# Patient Record
Sex: Female | Born: 1965 | Race: White | Hispanic: No | Marital: Single | Smoking: Former smoker
Health system: Southern US, Community
[De-identification: ages and names within clinical notes are randomized; demographics above are authoritative.]

## PROBLEM LIST (undated history)

## (undated) DIAGNOSIS — R000 Tachycardia, unspecified: Secondary | ICD-10-CM

## (undated) DIAGNOSIS — Q046 Congenital cerebral cysts: Secondary | ICD-10-CM

## (undated) DIAGNOSIS — G35 Multiple sclerosis: Secondary | ICD-10-CM

## (undated) DIAGNOSIS — I10 Essential (primary) hypertension: Secondary | ICD-10-CM

## (undated) DIAGNOSIS — F329 Major depressive disorder, single episode, unspecified: Secondary | ICD-10-CM

## (undated) DIAGNOSIS — F32A Depression, unspecified: Secondary | ICD-10-CM

## (undated) HISTORY — DX: Depression, unspecified: F32.A

## (undated) HISTORY — DX: Multiple sclerosis: G35

## (undated) HISTORY — PX: COLONOSCOPY W/ POLYPECTOMY: SHX1380

## (undated) HISTORY — DX: Tachycardia, unspecified: R00.0

## (undated) HISTORY — DX: Congenital cerebral cysts: Q04.6

## (undated) HISTORY — PX: ACHILLES TENDON SURGERY: SHX542

## (undated) HISTORY — DX: Essential (primary) hypertension: I10

## (undated) HISTORY — DX: Major depressive disorder, single episode, unspecified: F32.9

---

## 1998-01-05 ENCOUNTER — Other Ambulatory Visit: Admission: RE | Admit: 1998-01-05 | Discharge: 1998-01-05 | Payer: Self-pay | Admitting: Obstetrics and Gynecology

## 1998-08-17 ENCOUNTER — Ambulatory Visit (HOSPITAL_COMMUNITY): Admission: RE | Admit: 1998-08-17 | Discharge: 1998-08-17 | Payer: Self-pay | Admitting: Neurosurgery

## 1998-08-17 ENCOUNTER — Encounter: Payer: Self-pay | Admitting: Neurosurgery

## 1998-09-25 ENCOUNTER — Ambulatory Visit (HOSPITAL_COMMUNITY): Admission: RE | Admit: 1998-09-25 | Discharge: 1998-09-25 | Payer: Self-pay | Admitting: Neurology

## 1999-02-14 ENCOUNTER — Ambulatory Visit (HOSPITAL_COMMUNITY): Admission: RE | Admit: 1999-02-14 | Discharge: 1999-02-14 | Payer: Self-pay | Admitting: Neurology

## 1999-02-14 ENCOUNTER — Encounter: Payer: Self-pay | Admitting: Neurology

## 1999-04-14 ENCOUNTER — Other Ambulatory Visit: Admission: RE | Admit: 1999-04-14 | Discharge: 1999-04-14 | Payer: Self-pay | Admitting: Obstetrics and Gynecology

## 1999-07-22 ENCOUNTER — Encounter: Payer: Self-pay | Admitting: Neurology

## 1999-07-22 ENCOUNTER — Ambulatory Visit (HOSPITAL_COMMUNITY): Admission: RE | Admit: 1999-07-22 | Discharge: 1999-07-22 | Payer: Self-pay | Admitting: Neurology

## 2000-01-31 ENCOUNTER — Encounter: Payer: Self-pay | Admitting: Neurology

## 2000-01-31 ENCOUNTER — Ambulatory Visit (HOSPITAL_COMMUNITY): Admission: RE | Admit: 2000-01-31 | Discharge: 2000-01-31 | Payer: Self-pay | Admitting: Neurology

## 2000-09-06 ENCOUNTER — Other Ambulatory Visit: Admission: RE | Admit: 2000-09-06 | Discharge: 2000-09-06 | Payer: Self-pay | Admitting: *Deleted

## 2000-10-23 ENCOUNTER — Encounter: Payer: Self-pay | Admitting: Neurology

## 2000-10-23 ENCOUNTER — Ambulatory Visit (HOSPITAL_COMMUNITY): Admission: RE | Admit: 2000-10-23 | Discharge: 2000-10-23 | Payer: Self-pay | Admitting: Neurology

## 2001-10-25 ENCOUNTER — Other Ambulatory Visit: Admission: RE | Admit: 2001-10-25 | Discharge: 2001-10-25 | Payer: Self-pay | Admitting: *Deleted

## 2002-02-26 ENCOUNTER — Ambulatory Visit (HOSPITAL_COMMUNITY): Admission: RE | Admit: 2002-02-26 | Discharge: 2002-02-26 | Payer: Self-pay | Admitting: Neurology

## 2002-02-26 ENCOUNTER — Encounter: Payer: Self-pay | Admitting: Neurology

## 2002-07-02 ENCOUNTER — Inpatient Hospital Stay (HOSPITAL_COMMUNITY): Admission: AD | Admit: 2002-07-02 | Discharge: 2002-07-02 | Payer: Self-pay | Admitting: Obstetrics & Gynecology

## 2002-07-11 HISTORY — PX: OTHER SURGICAL HISTORY: SHX169

## 2002-10-30 ENCOUNTER — Other Ambulatory Visit: Admission: RE | Admit: 2002-10-30 | Discharge: 2002-10-30 | Payer: Self-pay | Admitting: Obstetrics and Gynecology

## 2009-02-19 ENCOUNTER — Ambulatory Visit: Payer: Self-pay | Admitting: Family Medicine

## 2009-02-19 DIAGNOSIS — G35 Multiple sclerosis: Secondary | ICD-10-CM | POA: Insufficient documentation

## 2009-02-19 DIAGNOSIS — I10 Essential (primary) hypertension: Secondary | ICD-10-CM | POA: Insufficient documentation

## 2009-02-20 DIAGNOSIS — R740 Nonspecific elevation of levels of transaminase and lactic acid dehydrogenase [LDH]: Secondary | ICD-10-CM

## 2009-02-20 DIAGNOSIS — R7401 Elevation of levels of liver transaminase levels: Secondary | ICD-10-CM | POA: Insufficient documentation

## 2009-02-20 LAB — CONVERTED CEMR LAB
BUN: 10 mg/dL (ref 6–23)
CO2: 22 meq/L (ref 19–32)
Calcium: 9.1 mg/dL (ref 8.4–10.5)
Chloride: 106 meq/L (ref 96–112)
Cholesterol: 191 mg/dL (ref 0–200)
Creatinine, Ser: 0.72 mg/dL (ref 0.40–1.20)
Glucose, Bld: 105 mg/dL — ABNORMAL HIGH (ref 70–99)
HDL: 58 mg/dL (ref >39)
Total CHOL/HDL Ratio: 3.3

## 2009-02-23 ENCOUNTER — Encounter: Payer: Self-pay | Admitting: Family Medicine

## 2009-02-27 ENCOUNTER — Encounter: Payer: Self-pay | Admitting: Family Medicine

## 2009-03-02 LAB — CONVERTED CEMR LAB
Albumin: 4.4 g/dL (ref 3.5–5.2)
Alkaline Phosphatase: 74 units/L (ref 39–117)
HCV Ab: NEGATIVE
Indirect Bilirubin: 0.4 mg/dL (ref 0.0–0.9)
Total Bilirubin: 0.5 mg/dL (ref 0.3–1.2)

## 2009-03-06 ENCOUNTER — Encounter: Admission: RE | Admit: 2009-03-06 | Discharge: 2009-03-06 | Payer: Self-pay | Admitting: Family Medicine

## 2009-03-12 ENCOUNTER — Ambulatory Visit: Payer: Self-pay | Admitting: Family Medicine

## 2009-03-23 ENCOUNTER — Encounter: Admission: RE | Admit: 2009-03-23 | Discharge: 2009-03-23 | Payer: Self-pay | Admitting: Obstetrics and Gynecology

## 2009-03-27 ENCOUNTER — Encounter: Payer: Self-pay | Admitting: Family Medicine

## 2009-03-27 LAB — CONVERTED CEMR LAB
ALT: 30 units/L (ref 0–35)
Alkaline Phosphatase: 53 units/L (ref 39–117)
Bilirubin, Direct: 0.1 mg/dL (ref 0.0–0.3)
Indirect Bilirubin: 0.3 mg/dL (ref 0.0–0.9)
Total Protein: 7 g/dL (ref 6.0–8.3)

## 2009-05-01 ENCOUNTER — Ambulatory Visit: Payer: Self-pay | Admitting: Family Medicine

## 2009-05-04 DIAGNOSIS — R7301 Impaired fasting glucose: Secondary | ICD-10-CM | POA: Insufficient documentation

## 2009-05-04 LAB — CONVERTED CEMR LAB
BUN: 16 mg/dL (ref 6–23)
Chloride: 103 meq/L (ref 96–112)
Creatinine, Ser: 0.76 mg/dL (ref 0.40–1.20)
Glucose, Bld: 114 mg/dL — ABNORMAL HIGH (ref 70–99)

## 2009-09-07 IMAGING — US US ABDOMEN COMPLETE
1 series · 14 of 25 positions shown · non-contrast
Comparison: None

CLINICAL DATA: Elevated LFTs.

COMPLETE ABDOMINAL ULTRASOUND

[Series 1: us abdomen complete · 0.33mm/px · 14 of 61 slices shown]
[im 1/61]
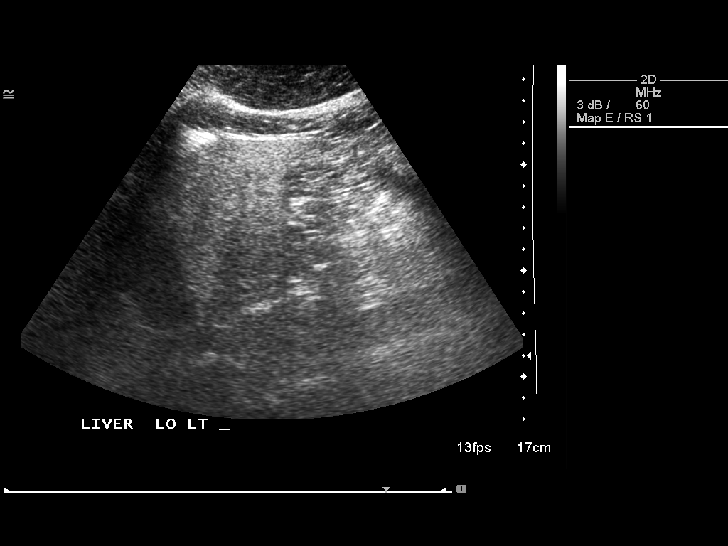
[im 6/61]
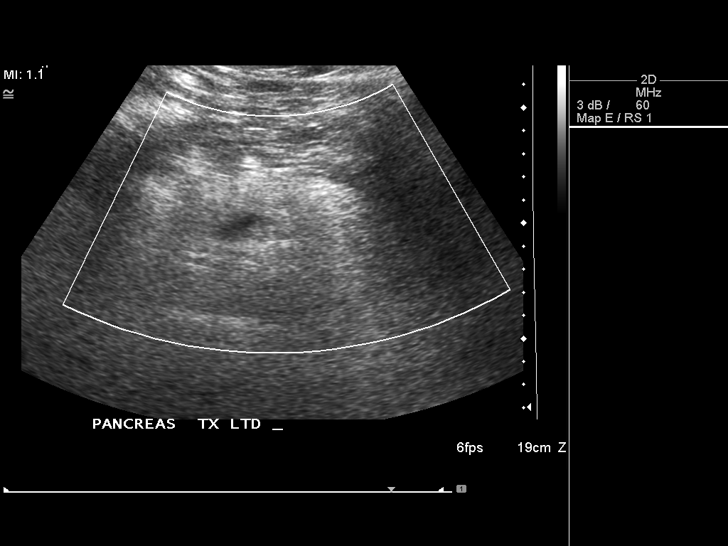
[im 11/61]
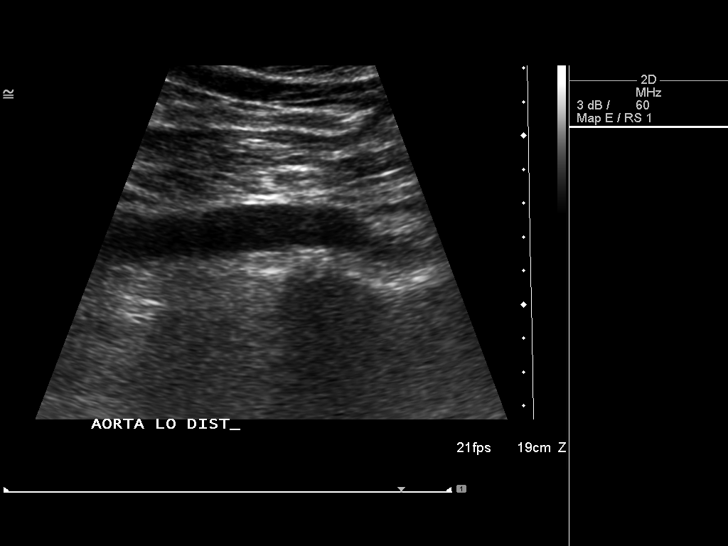
[im 16/61]
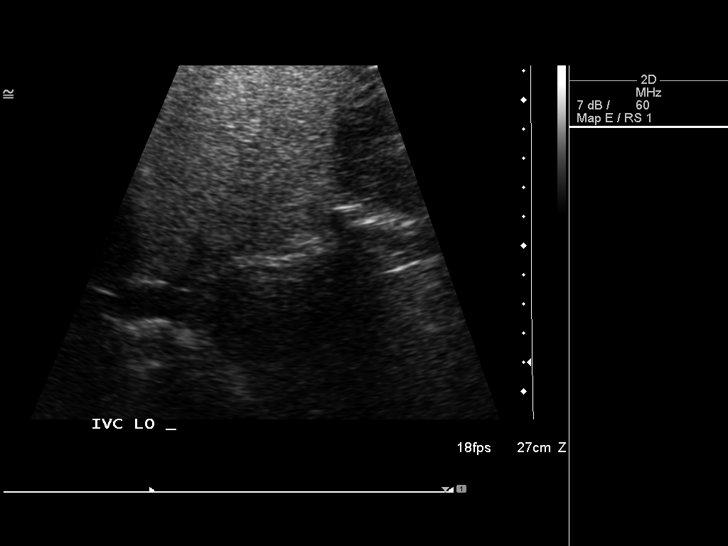
[im 21/61]
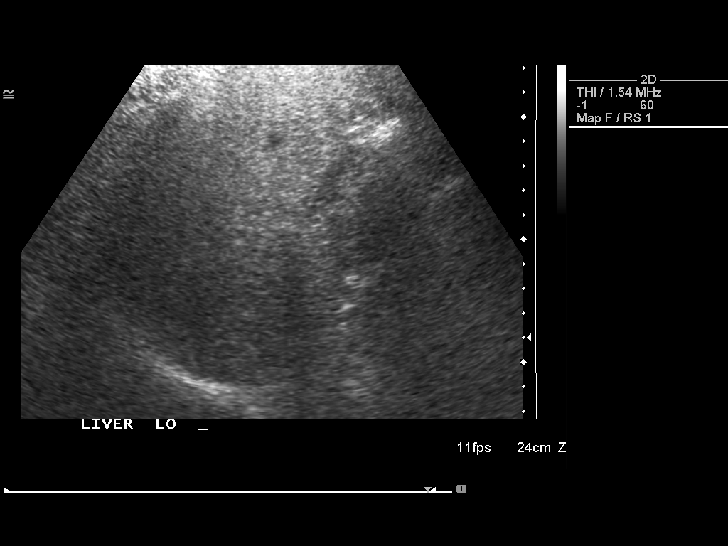
[im 23/61]
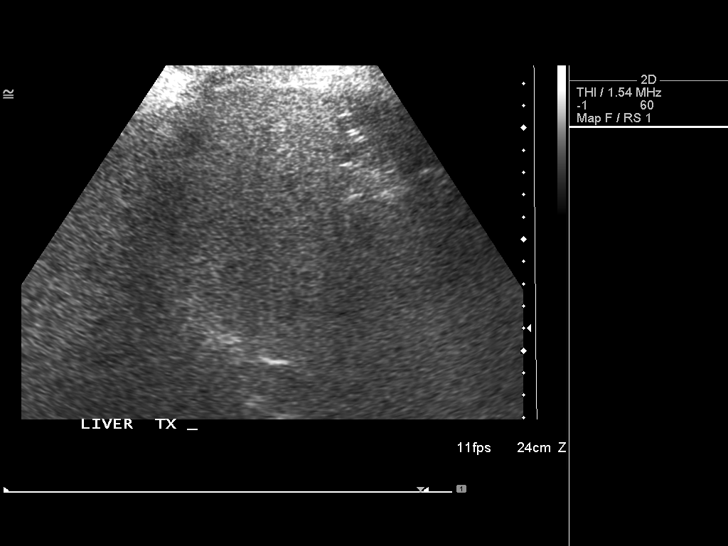
[im 28/61]
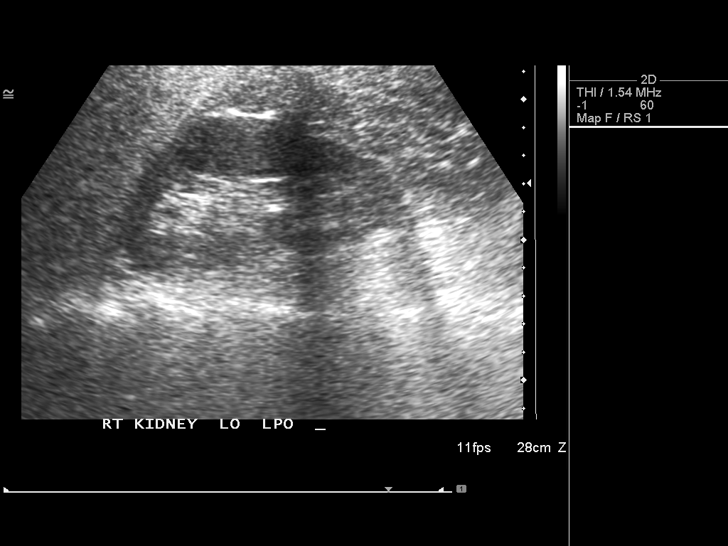
[im 33/61]
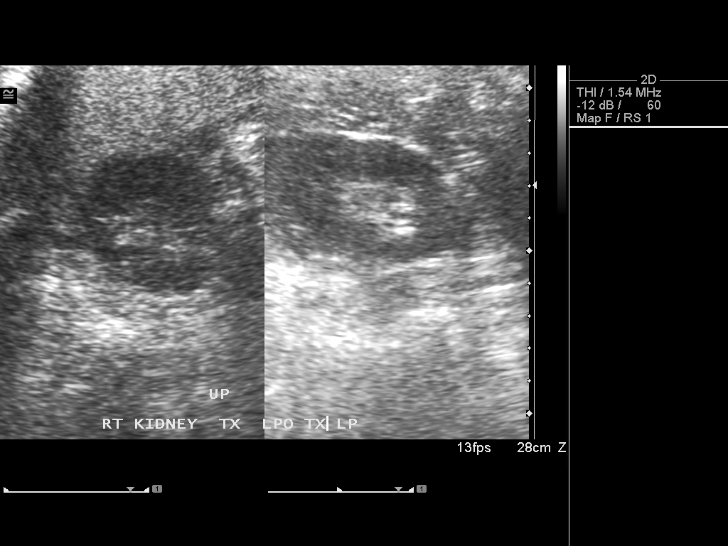
[im 38/61]
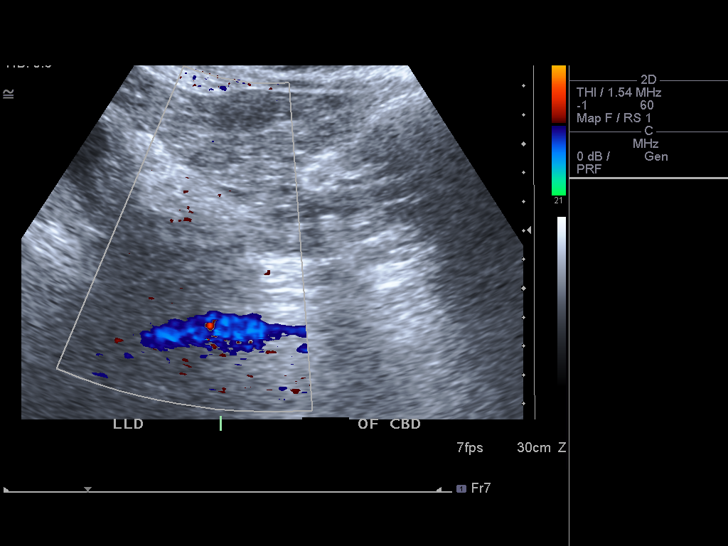
[im 41/61]
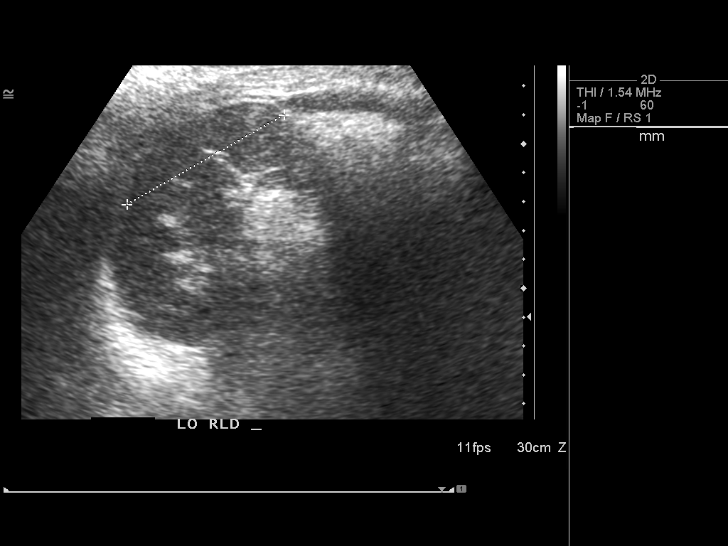
[im 46/61]
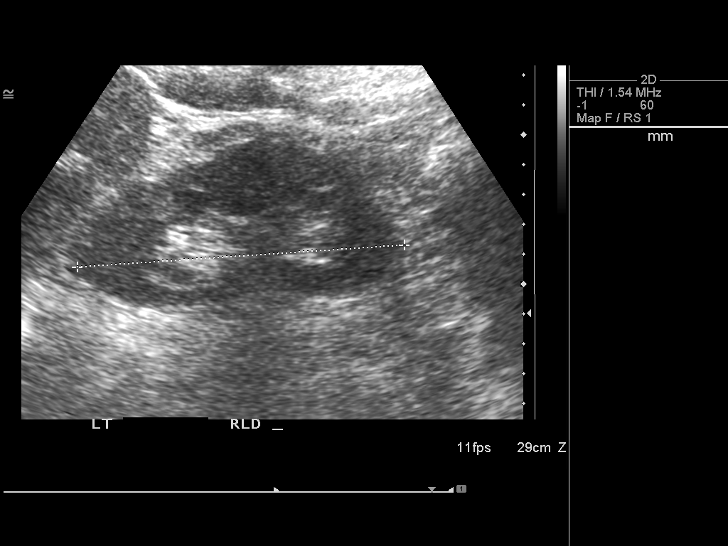
[im 51/61]
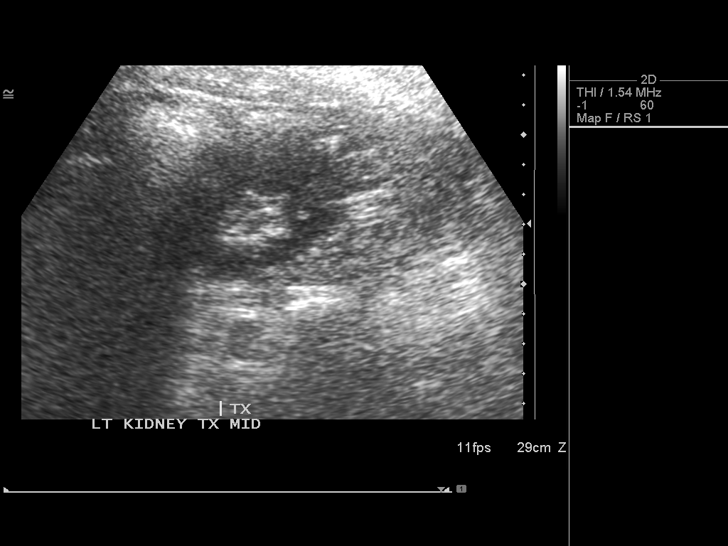
[im 56/61]
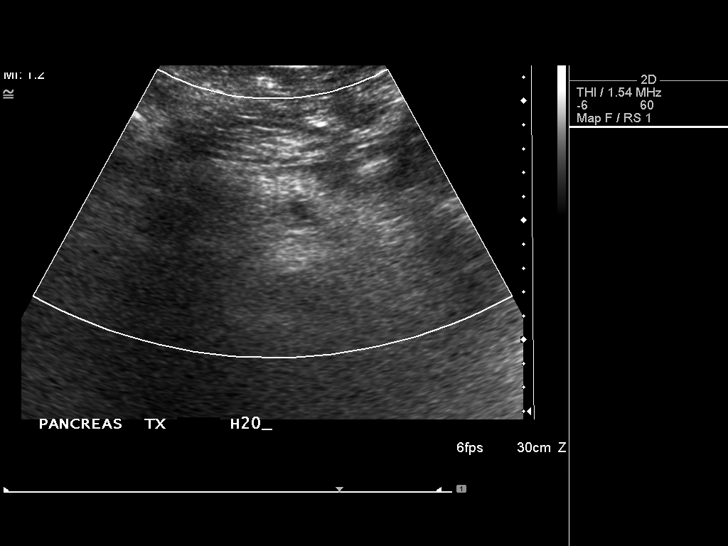
[im 61/61]
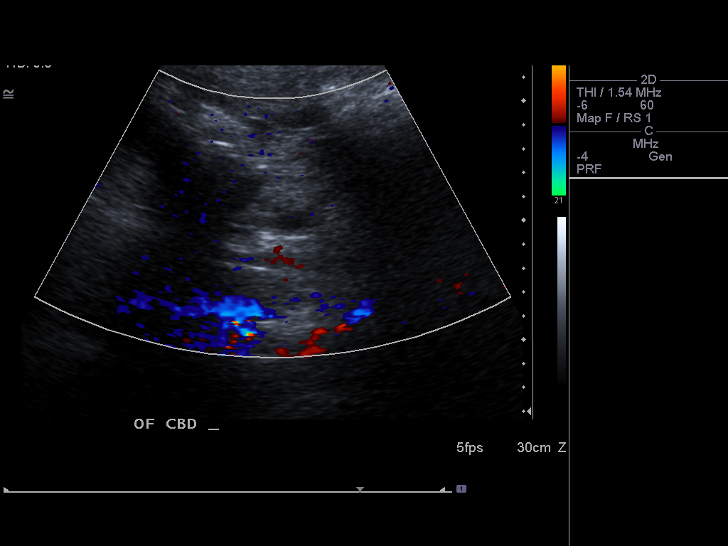

[14 of 25 positions shown; findings below may reference images not displayed]

FINDINGS: Gallbladder:  Limited visualization due to body habitus and bowel
gas.  No stones.  No tenderness over the gallbladder.

Common bile duct:  Cannot visualize due to bowel gas and body
habitus.

Liver:  Increased echogenicity compatible with fatty infiltration.
No focal abnormality.

IVC:  Visualized portions patent.

Pancreas:  Normal size and echotexture.  No focal abnormality.

Spleen:  Normal size and echotexture.  No focal abnormality.

Right Kidney:  Normal size and echotexture.  No focal abnormality.
No hydronephrosis.

Left Kidney:  Normal size and echotexture.  No focal abnormality.
No hydronephrosis.

Abdominal aorta:  Limited visualization, normal caliber, 1.9 cm.
IMPRESSION: Fatty infiltration of the liver.

Study somewhat limited by body habitus and bowel gas.

## 2009-10-12 ENCOUNTER — Ambulatory Visit: Payer: Self-pay | Admitting: Family Medicine

## 2009-10-12 DIAGNOSIS — H1045 Other chronic allergic conjunctivitis: Secondary | ICD-10-CM | POA: Insufficient documentation

## 2009-12-02 ENCOUNTER — Encounter: Payer: Self-pay | Admitting: Family Medicine

## 2010-03-11 ENCOUNTER — Encounter: Payer: Self-pay | Admitting: Family Medicine

## 2010-04-14 ENCOUNTER — Encounter: Admission: RE | Admit: 2010-04-14 | Discharge: 2010-04-14 | Payer: Self-pay | Admitting: Obstetrics and Gynecology

## 2010-08-12 NOTE — Assessment & Plan Note (Signed)
Summary: ALLERGIC conjunctivitis   Vital Signs:  Patient profile:   45 year old female Height:      64 inches Weight:      212 pounds Temp:     97.6 degrees F oral Pulse rate:   62 / minute BP sitting:   118 / 71  (left arm) Cuff size:   large  Vitals Entered By: Kathlene November (October 12, 2009 10:05 AM) CC: eyes red, itchy, swollen underneath, white drainage, matted shut this morning   Primary Care Provider:  Nani Gasser, MD  CC:  eyes red, itchy, swollen underneath, white drainage, and matted shut this morning.  History of Present Illness: eyes red, itchy, swollen underneath, white drainage, matted shut this morning. Hx of allergies for a couple of weeks. Eyes started bothering her last night.  Took 2 benadryl last night. Was outsdie all day yesterday. No new eye make up etc.  They are itchy.  Taking Zyrtec every day. Also some nasal congestion.  Never used an allergy spray.    Current Medications (verified): 1)  Zoloft 50 Mg Tabs (Sertraline Hcl) .... Take One Tablety By Mouth Once A Day 2)  Rebif 22 Mcg/0.33ml Soln (Interferon Beta-1a) .... Inject 3 Times A Week 3)  Caltrate 600 1500 Mg Tabs (Calcium Carbonate) .... Take One Tablet By Mouth Once A Day 4)  Lisinopril-Hydrochlorothiazide 10-12.5 Mg Tabs (Lisinopril-Hydrochlorothiazide) .... Take 1 Tablet By Mouth Once A Day in Am  Allergies (verified): No Known Drug Allergies  Comments:  Nurse/Medical Assistant: The patient's medications and allergies were reviewed with the patient and were updated in the Medication and Allergy Lists. Kathlene November (October 12, 2009 10:06 AM)  Physical Exam  General:  Well-developed,well-nourished,in no acute distress; alert,appropriate and cooperative throughout examination Eyes:  MIld lid edema.  PEERL and EOM intact. Sclera is very inflammed.  Ears:  External ear exam shows no significant lesions or deformities.  Otoscopic examination reveals clear canals, tympanic membranes are intact  bilaterally without bulging, retraction, inflammation or discharge. Hearing is grossly normal bilaterally. Nose:  External nasal examination shows no deformity or inflammation.  Mouth:  Oral mucosa and oropharynx without lesions or exudates.  Teeth in good repair. Neck:  No deformities, masses, or tenderness noted. Lungs:  Normal respiratory effort, chest expands symmetrically. Lungs are clear to auscultation, no crackles or wheezes. Heart:  Normal rate and regular rhythm. S1 and S2 normal without gallop, murmur, click, rub or other extra sounds. Skin:  no rashes.  Her arms and upper chest are sunburned.  Psych:  Cognition and judgment appear intact. Alert and cooperative with normal attention span and concentration. No apparent delusions, illusions, hallucinations   Impression & Recommendations:  Problem # 1:  CONJUNCTIVITIS, ALLERGIC (ICD-372.14)  Discussed treatment, and urged patient to wash hands carefully after touching face. rinse eyes with saline solution and clean lashes nightly.  Start veramyst in additoin ot her zyrtec and added pataday for her eyes.Can also switch to Zyrtec D for her nasal congestion.   Complete Medication List: 1)  Zoloft 50 Mg Tabs (Sertraline hcl) .... Take one tablety by mouth once a day 2)  Rebif 22 Mcg/0.40ml Soln (Interferon beta-1a) .... Inject 3 times a week 3)  Caltrate 600 1500 Mg Tabs (Calcium carbonate) .... Take one tablet by mouth once a day 4)  Lisinopril-hydrochlorothiazide 10-12.5 Mg Tabs (Lisinopril-hydrochlorothiazide) .... Take 1 tablet by mouth once a day in am 5)  Veramyst 27.5 Mcg/spray Susp (Fluticasone furoate) .... 2 sprays in  each nostril daily 6)  Pataday 0.2 % Soln (Olopatadine hcl) .[name] Kitchen.. 1 drop in each eye daily  Patient Instructions: 1)  Veramyst: 2 squirts in each nostril once a day 2)  Pataday: one drop in each eye once a day.  3)  If working well then call for prescription.  4)  Call if getting worse.

## 2010-08-12 NOTE — Letter (Signed)
Summary: [facility]  [facility]   Imported By: Lanelle Bal 04/12/2010 09:54:22  _____________________________________________________________________  External Attachment:    Type:   Image     Comment:   External Document

## 2010-08-12 NOTE — Letter (Signed)
Summary: [facility]   Imported By: Lanelle Bal 12/21/2009 08:27:14  _____________________________________________________________________  External Attachment:    Type:   Image     Comment:   External Document

## 2011-01-13 ENCOUNTER — Other Ambulatory Visit: Payer: Self-pay | Admitting: Family Medicine

## 2011-01-13 NOTE — Telephone Encounter (Signed)
eceived fax from [employer] and a mail order request for 90 supply was sent for lisinopril HCTZ 10-12.5 mg.  Pt has not been in recently for BP check.  Plan:  Pt notified and LMOM for pt instructing her to call the office and schedule and office visit for HTN.  It has been a while since she was in.  Told pt we can only send a 30 day local supply til she gets in to have BP checked.  Since no local pharm  Is loaded into the system pt instructed to also call triage nurse and give local pharm so we can send 30 day supply only. Jarvis Newcomer, LPN [facility]

## 2011-01-18 ENCOUNTER — Telehealth: Payer: Self-pay | Admitting: Family Medicine

## 2011-01-18 ENCOUNTER — Ambulatory Visit (INDEPENDENT_AMBULATORY_CARE_PROVIDER_SITE_OTHER): Payer: Commercial Managed Care - PPO | Admitting: Family Medicine

## 2011-01-18 VITALS — BP 147/93 | HR 66 | Ht 63.0 in | Wt 218.0 lb

## 2011-01-18 DIAGNOSIS — I10 Essential (primary) hypertension: Secondary | ICD-10-CM

## 2011-01-18 MED ORDER — LISINOPRIL-HYDROCHLOROTHIAZIDE 20-12.5 MG PO TABS: 1.0000 | ORAL_TABLET | Freq: Every day | ORAL | Status: DC

## 2011-01-18 NOTE — Progress Notes (Signed)
  Subjective:    Patient ID: Alyssa Bass, female    DOB: September 14, 1965, 45 y.o.   MRN: [id number]  HPI  . Pt denies chest pain, SOB, dizziness, or heart palpitations.  Taking meds as directed w/o problems.  Denies medication side effects.  5 min spent with pt.  Review of Systems     Objective:   Physical Exam        Assessment & Plan:

## 2011-01-18 NOTE — Progress Notes (Signed)
  Subjective:    Patient ID: Alyssa Bass, female    DOB: 06-Jan-1966, 45 y.o.   MRN: [id number]  HPI    Review of Systems     Objective:   Physical Exam        Assessment & Plan:  Spoke w/ pt. Advised of med change and the need to make an appt. Pt agreed.

## 2011-01-18 NOTE — Telephone Encounter (Signed)
Called pt and notified her of change in med and to make an appt.

## 2011-01-18 NOTE — Assessment & Plan Note (Signed)
BP not at goal. Will increase ACE dose and have her f/u with med in 1 month.

## 2011-02-18 ENCOUNTER — Encounter: Payer: Self-pay | Admitting: Family Medicine

## 2011-02-22 ENCOUNTER — Ambulatory Visit (INDEPENDENT_AMBULATORY_CARE_PROVIDER_SITE_OTHER): Payer: Commercial Managed Care - PPO | Admitting: Family Medicine

## 2011-02-22 ENCOUNTER — Encounter: Payer: Self-pay | Admitting: Family Medicine

## 2011-02-22 VITALS — BP 150/91 | HR 71 | Ht 63.0 in | Wt 223.0 lb

## 2011-02-22 DIAGNOSIS — R0683 Snoring: Secondary | ICD-10-CM

## 2011-02-22 DIAGNOSIS — I10 Essential (primary) hypertension: Secondary | ICD-10-CM

## 2011-02-22 DIAGNOSIS — R0989 Other specified symptoms and signs involving the circulatory and respiratory systems: Secondary | ICD-10-CM

## 2011-02-22 MED ORDER — LISINOPRIL-HYDROCHLOROTHIAZIDE 20-12.5 MG PO TABS: 1.0000 | ORAL_TABLET | Freq: Every day | ORAL | Status: DC

## 2011-02-22 NOTE — Assessment & Plan Note (Signed)
Discussed that she feels her BP is mcuh high than usual at home. Discussed getting back on home diet (she had been eating out a lot) and then recheck BP in 1-2 weeks. If back under control then continue current regime and f/u in 6 months. If elevated then will adust meds.

## 2011-02-22 NOTE — Progress Notes (Signed)
  Subjective:    Patient ID: Alyssa Bass, female    DOB: [birth year], 45 y.o.   MRN: [id number]  HPI  Home BPs have been well controlled. tooks med this AM.  No CP, SOB, HA or dizziness. Says has been having some hotflahses  And her stomach has been hurting some this week. Feels almost like gas pain. Maybe some bloated.    No SE on the medications. Just got bck from vacation and ate out a lot.   Not sure why hwer BP is elevated today. Takes her med regularly.   Review of Systems     Objective:   Physical Exam  Constitutional: She is oriented to person, place, and time. She appears well-developed and well-nourished.  HENT:  Head: Normocephalic and atraumatic.  Cardiovascular: Normal rate, regular rhythm and normal heart sounds.   Pulmonary/Chest: Effort normal and breath sounds normal.  Musculoskeletal: She exhibits no edema.  Neurological: She is alert and oriented to person, place, and time.  Skin: Skin is warm and dry.  Psychiatric: She has a normal mood and affect. Her behavior is normal.          Assessment & Plan:

## 2011-03-16 ENCOUNTER — Ambulatory Visit (INDEPENDENT_AMBULATORY_CARE_PROVIDER_SITE_OTHER): Payer: Commercial Managed Care - PPO | Admitting: Family Medicine

## 2011-03-16 ENCOUNTER — Other Ambulatory Visit: Payer: Self-pay | Admitting: Obstetrics and Gynecology

## 2011-03-16 DIAGNOSIS — I10 Essential (primary) hypertension: Secondary | ICD-10-CM

## 2011-03-16 DIAGNOSIS — Z1231 Encounter for screening mammogram for malignant neoplasm of breast: Secondary | ICD-10-CM

## 2011-03-16 NOTE — Progress Notes (Signed)
  Subjective:    Patient ID: Alyssa Bass, female    DOB: [birth year], 45 y.o.   MRN: [id number]  HPI  Pt denies chest pain, SOB, dizziness, or heart palpitations.  Taking meds as directed w/o problems.  Denies medication side effects.  5 min spent with pt.   Review of Systems     Objective:   Physical Exam        Assessment & Plan:  HTN- BP improved today on new medication. F./U in 6 months with MD.

## 2011-03-17 ENCOUNTER — Telehealth: Payer: Self-pay | Admitting: Family Medicine

## 2011-03-17 LAB — COMPLETE METABOLIC PANEL WITH GFR
AST: 29 U/L (ref 0–37)
Albumin: 4 g/dL (ref 3.5–5.2)
Alkaline Phosphatase: 69 U/L (ref 39–117)
BUN: 15 mg/dL (ref 6–23)
Creat: 0.72 mg/dL (ref 0.50–1.10)
GFR, Est Non African American: >60 mL/min (ref >60)
Potassium: 4.5 mEq/L (ref 3.5–5.3)
Total Bilirubin: 0.5 mg/dL (ref 0.3–1.2)

## 2011-03-17 LAB — LIPID PANEL
Cholesterol: 177 mg/dL (ref 0–200)
HDL: 44 mg/dL (ref >39)
Total CHOL/HDL Ratio: 4 Ratio
Triglycerides: 214 mg/dL — ABNORMAL HIGH (ref <150)
VLDL: 43 mg/dL — ABNORMAL HIGH (ref 0–40)

## 2011-03-17 MED ORDER — LISINOPRIL-HYDROCHLOROTHIAZIDE 20-12.5 MG PO TABS: 1.0000 | ORAL_TABLET | Freq: Every day | ORAL | Status: DC

## 2011-03-17 NOTE — Telephone Encounter (Signed)
Pt aware.

## 2011-03-17 NOTE — Telephone Encounter (Signed)
Pt called and stated she had received a call from [place] and left lab results on her VM.  Needs more clarification.  Pt also needs to know if she can get a 90 day supply of her BP med sent to [facility] since her BP reading yesterday was 125/86. Plan:  Pt notified, and needs the #'s of chol, sugar, etc.  Discussed lab #'s in detail with the pt.  Sent 90 day supply since stated pt BP rechecked yesterday and to fup in 6 mths. Jarvis Newcomer, LPN [facility]

## 2011-03-17 NOTE — Telephone Encounter (Signed)
Call pt: Alyssa Bass is 89.  One of the liver enzymes is back up a little. LDL cholesterol is better., but TG are up a little. Make sure getting some regular exercise adn eating a low fat diet. Recheck chol in one year.

## 2011-03-21 ENCOUNTER — Telehealth: Payer: Self-pay | Admitting: Family Medicine

## 2011-03-21 ENCOUNTER — Encounter: Payer: Self-pay | Admitting: Family Medicine

## 2011-03-21 NOTE — Telephone Encounter (Signed)
Called patient did receive her sleep study report and she does have some mild sleep apnea. These sleep specialist that read the study who is Dr. Orie Rout will contact her about the details and schedule her for an appointment to discuss treatment.

## 2011-03-21 NOTE — Telephone Encounter (Signed)
Pt aware.

## 2011-04-19 ENCOUNTER — Ambulatory Visit
Admission: RE | Admit: 2011-04-19 | Discharge: 2011-04-19 | Disposition: A | Discharge status: Home / Self Care | Payer: Commercial Managed Care - PPO | Source: Ambulatory Visit | Attending: Obstetrics and Gynecology | Admitting: Obstetrics and Gynecology

## 2011-04-19 DIAGNOSIS — Z1231 Encounter for screening mammogram for malignant neoplasm of breast: Secondary | ICD-10-CM

## 2011-05-23 ENCOUNTER — Encounter: Payer: Self-pay | Admitting: Family Medicine

## 2011-05-23 ENCOUNTER — Ambulatory Visit (INDEPENDENT_AMBULATORY_CARE_PROVIDER_SITE_OTHER): Payer: Commercial Managed Care - PPO | Admitting: Family Medicine

## 2011-05-23 VITALS — BP 144/87 | HR 88 | Temp 97.8°F | Wt 222.0 lb

## 2011-05-23 DIAGNOSIS — J40 Bronchitis, not specified as acute or chronic: Secondary | ICD-10-CM

## 2011-05-23 MED ORDER — HYDROCODONE-HOMATROPINE 5-1.5 MG/5ML PO SYRP: 5.0000 mL | ORAL_SOLUTION | Freq: Every evening | ORAL | Status: AC | PRN

## 2011-05-23 MED ORDER — BENZONATATE 200 MG PO CAPS: 200.0000 mg | ORAL_CAPSULE | Freq: Three times a day (TID) | ORAL | Status: AC | PRN

## 2011-05-23 MED ORDER — AZITHROMYCIN 250 MG PO TABS: ORAL_TABLET | ORAL | Status: AC

## 2011-05-23 NOTE — Patient Instructions (Signed)
Call if not better in 10 days.   

## 2011-05-23 NOTE — Progress Notes (Signed)
  Subjective:    Patient ID: Alyssa Bass, female    DOB: 01/20/1966, 45 y.o.   MRN: [id number]  HPI Woke up with severe in sinu pessure 8 days ago and then moved into her chest.  Productive sputum and now chest hurts with cough. Sinus pressure and nasal congestion is better. Taking Tylenol cold and Flu and not helping. No fever.  No GI sxs.  No ST or ear pain.  No SOB. Wheeze at night when trying to sleep.    Review of Systems     Objective:   Physical Exam  Constitutional: She is oriented to person, place, and time. She appears well-developed and well-nourished.  HENT:  Head: Normocephalic and atraumatic.  Right Ear: External ear normal.  Left Ear: External ear normal.  Nose: Nose normal.  Mouth/Throat: Oropharynx is clear and moist.       TMs and canals are clear.   Eyes: Conjunctivae and EOM are normal. Pupils are equal, round, and reactive to light.  Neck: Neck supple. No thyromegaly present.  Cardiovascular: Normal rate, regular rhythm and normal heart sounds.   Pulmonary/Chest: Effort normal and breath sounds normal. She has no wheezes.  Lymphadenopathy:    She has no cervical adenopathy.  Neurological: She is alert and oriented to person, place, and time.  Skin: Skin is warm and dry.  Psychiatric: She has a normal mood and affect.          Assessment & Plan:  Bronchitis - Likely viral. Explained that we don't use ABX for this anymore. Given nightime and daytime cough meds. Can also use delsym OTC. Did send over ABX if suddenly gets worse. O/W call if not some better in 10 days. Rec use humidifier at night to help with cough.  Stay hydrated.

## 2011-06-27 ENCOUNTER — Other Ambulatory Visit: Payer: Self-pay | Admitting: *Deleted

## 2011-06-27 MED ORDER — LISINOPRIL-HYDROCHLOROTHIAZIDE 20-12.5 MG PO TABS: 1.0000 | ORAL_TABLET | Freq: Every day | ORAL | Status: DC

## 2011-09-13 ENCOUNTER — Encounter (INDEPENDENT_AMBULATORY_CARE_PROVIDER_SITE_OTHER): Payer: Self-pay | Admitting: General Surgery

## 2011-09-13 ENCOUNTER — Ambulatory Visit (INDEPENDENT_AMBULATORY_CARE_PROVIDER_SITE_OTHER): Payer: Commercial Managed Care - PPO | Admitting: General Surgery

## 2011-09-13 VITALS — BP 138/88 | HR 98 | Temp 98.9°F | Resp 18 | Ht 64.0 in | Wt 215.6 lb

## 2011-09-13 DIAGNOSIS — R21 Rash and other nonspecific skin eruption: Secondary | ICD-10-CM

## 2011-09-13 NOTE — Progress Notes (Signed)
Patient ID: Alyssa Bass, female   DOB: [birth year], 46 y.o.   MRN: [id number]  Chief Complaint  Patient presents with  . Cyst    new pt- eval pilo cyst    HPI Alyssa Bass is a 46 y.o. female.This patient was referred by Dr. presents for evaluation of a possible pilonidal cyst. She states that she first noticed some drainage in the area of her midline buttock region approximately one year ago. She has intermittent and occasional drainage which she describes as a very slight amount. She did not notice any particular discomfort in the area and has not had any redness or fevers. HPI  Past Medical History  Diagnosis Date  . 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 Hypertension   . Depression   . MS (multiple sclerosis)     Past Surgical History  Procedure Date  . Achilles tendon surgery     Hagland deformity  . Left ankle surgery 2004  . Colonoscopy w/ polypectomy     Family History  Problem Relation Age of Onset  . Cancer      colon  . Heart attack      grandmother  . Hypertension Brother   . Hypertension Mother   . Sleep apnea Mother   . Sleep apnea Brother   . Sleep apnea Sister   . Cancer Father     pre cancerous colon    Social History History  Substance Use Topics  . Smoking status: Former Games developer  . Smokeless tobacco: Not on file  . Alcohol Use: Yes    No Known Allergies  Current Outpatient Prescriptions  Medication Sig Dispense Refill  . Calcium Carbonate (CALTRATE 600) 1500 MG TABS Take by mouth daily.        . interferon beta-1a (REBIF) 22 MCG/0.5ML injection Inject 22 mcg into the skin 3 (three) times a week.        [name] Kitchen lisinopril-hydrochlorothiazide (PRINZIDE,ZESTORETIC) 20-12.5 MG per tablet Take 1 tablet by mouth daily.  90 tablet  1  . sertraline (ZOLOFT) 50 MG tablet Take 50 mg by mouth daily.          Review of Systems Review of Systems All other review of systems negative or noncontributory except as stated in the HPI  Blood pressure  138/88, pulse 98, temperature 98.9 F (37.2 C), temperature source Temporal, resp. rate 18, height 5\' 4"  (1.626 m), weight 215 lb 9.6 oz (97.796 kg).  Physical Exam Physical Exam Physical Exam  Nursing note and vitals reviewed. Constitutional: She is oriented to person, place, and time. She appears well-developed and well-nourished. No distress.  HENT:  Head: Normocephalic and atraumatic.  Mouth/Throat: No oropharyngeal exudate.  Eyes: Conjunctivae and EOM are normal. Pupils are equal, round, and reactive to light. Right eye exhibits no discharge. Left eye exhibits no discharge. No scleral icterus.  Neck: Normal range of motion. Neck supple. No tracheal deviation present.  Cardiovascular: Normal rate Pulmonary/Chest: Effort normal No stridor. No respiratory distress. She has no wheezes.  Musculoskeletal: Normal range of motion. She exhibits no edema and no tenderness.  Neurological: She is alert and oriented to person, place, and time.  Skin: Skin is warm and dry. In the area of the buttocks and presacral region I did not see any area of inflammatory changes, erythema, tenderness, or drainage. We'll do not see any midline pits or any evidence of pilonidal cyst. She does have some moisture in the area and a slight rash  which I think is more just from moisture. There is a slight fissure in the skin in the areas well but no evidence of pilonidal cyst.. She is not diaphoretic. No erythema. No pallor.  Psychiatric: She has a normal mood and affect. Her behavior is normal. Judgment and thought content normal.   Data Reviewed   Assessment    Skin rash I do not see any evidence of pilonidal cyst on exam. She says that she has some intermittent drainage in the area I think that this is just moisture and possible skin rash but again no evidence of pilonidal cyst and see any evidence of infection or inflammatory change.    Plan    I recommended that she keep area dry and at this time, I would not  recommend any surgical intervention. I did not see any evidence of pilonidal cyst but I did recommend that she has continued drainage to come in acutely when it is draining so we can take a look at the area.       Lodema Pilot DAVID 09/13/2011, 8:47 AM

## 2011-10-26 ENCOUNTER — Telehealth: Payer: Self-pay | Admitting: *Deleted

## 2011-10-26 MED ORDER — OLOPATADINE HCL 0.2 % OP SOLN: 1.0000 [drp] | Freq: Every day | OPHTHALMIC | Status: DC

## 2011-10-26 NOTE — Telephone Encounter (Signed)
OK for refill.

## 2011-10-26 NOTE — Telephone Encounter (Signed)
Pt is requesting a refill on Pataday eye drops. She states that with the allergies she is having a hard time. Please advise.

## 2011-11-01 ENCOUNTER — Encounter: Payer: Self-pay | Admitting: Family Medicine

## 2011-11-01 DIAGNOSIS — G43909 Migraine, unspecified, not intractable, without status migrainosus: Secondary | ICD-10-CM | POA: Insufficient documentation

## 2011-11-01 DIAGNOSIS — G4733 Obstructive sleep apnea (adult) (pediatric): Secondary | ICD-10-CM | POA: Insufficient documentation

## 2012-01-25 ENCOUNTER — Other Ambulatory Visit: Payer: Self-pay | Admitting: *Deleted

## 2012-01-25 MED ORDER — LISINOPRIL-HYDROCHLOROTHIAZIDE 20-12.5 MG PO TABS: 1.0000 | ORAL_TABLET | Freq: Every day | ORAL | Status: DC

## 2012-03-14 ENCOUNTER — Other Ambulatory Visit: Payer: Self-pay | Admitting: Obstetrics and Gynecology

## 2012-03-14 DIAGNOSIS — Z1231 Encounter for screening mammogram for malignant neoplasm of breast: Secondary | ICD-10-CM

## 2012-03-21 ENCOUNTER — Other Ambulatory Visit: Payer: Self-pay | Admitting: Family Medicine

## 2012-04-24 ENCOUNTER — Ambulatory Visit (INDEPENDENT_AMBULATORY_CARE_PROVIDER_SITE_OTHER): Payer: Commercial Managed Care - PPO

## 2012-04-24 DIAGNOSIS — Z1231 Encounter for screening mammogram for malignant neoplasm of breast: Secondary | ICD-10-CM

## 2012-05-16 ENCOUNTER — Other Ambulatory Visit: Payer: Self-pay | Admitting: Family Medicine

## 2012-05-16 NOTE — Telephone Encounter (Signed)
Must make appointment 

## 2012-06-11 DIAGNOSIS — E348 Other specified endocrine disorders: Secondary | ICD-10-CM | POA: Insufficient documentation

## 2012-10-15 ENCOUNTER — Other Ambulatory Visit: Payer: Self-pay | Admitting: Family Medicine

## 2012-11-15 ENCOUNTER — Telehealth: Payer: Self-pay | Admitting: Neurology

## 2012-11-15 MED ORDER — SERTRALINE HCL 50 MG PO TABS: 50.0000 mg | ORAL_TABLET | Freq: Every day | ORAL | Status: DC

## 2012-11-15 NOTE — Telephone Encounter (Signed)
Message copied by Malachy Moan on Thu Nov 15, 2012  1:25 PM ------      Message from: Warren Lacy A      Created: Thu Nov 15, 2012 10:44 AM      Contact: Alyssa Bass        needs a refill on her Zoloft. [facility](3 month) [phone]. Only has a week left, last time she ran out, she cried all the time.            THanks ------

## 2012-11-15 NOTE — Telephone Encounter (Signed)
[facility] patient assigned to Dr Vickey Huger.  I called patient, she says she is waiting for a call back from [place] about appt.  Says she spoke with front desk and they told her they will call her back.

## 2012-11-21 ENCOUNTER — Encounter: Payer: Self-pay | Admitting: Family Medicine

## 2012-11-21 ENCOUNTER — Ambulatory Visit (INDEPENDENT_AMBULATORY_CARE_PROVIDER_SITE_OTHER): Payer: Commercial Managed Care - PPO | Admitting: Family Medicine

## 2012-11-21 VITALS — BP 135/81 | HR 67 | Ht 64.0 in | Wt 216.0 lb

## 2012-11-21 DIAGNOSIS — I10 Essential (primary) hypertension: Secondary | ICD-10-CM

## 2012-11-21 DIAGNOSIS — G35 Multiple sclerosis: Secondary | ICD-10-CM

## 2012-11-21 DIAGNOSIS — Z23 Encounter for immunization: Secondary | ICD-10-CM

## 2012-11-21 MED ORDER — LISINOPRIL-HYDROCHLOROTHIAZIDE 20-12.5 MG PO TABS: ORAL_TABLET | ORAL | Status: DC

## 2012-11-21 NOTE — Progress Notes (Signed)
  Subjective:    Patient ID: Alyssa Bass, female    DOB: 04-Sep-1965, 47 y.o.   MRN: [id number]  HPI HTN-  Pt denies chest pain, SOB, dizziness, or heart palpitations.  Taking meds as directed w/o problems.  Denies medication side effects.  5 min spent with pt.  Her neurologist retired and she is looking for a new neurologist. She is to  one of his partners  In October but she is concerned.  She wants to know if I have any specific recommendations for her. She also needs up-to-date blood work to look at her liver since she is on Rebif.  Needs flu shot and Tdap updated.  Her father-in-law was recently diagnosed with multiple myeloma and the family members were instructed to get up to date flu shots even though it after the usual season if they are not to be around him. Review of Systems     Objective:   Physical Exam  Constitutional: She is oriented to person, place, and time. She appears well-developed and well-nourished.  HENT:  Head: Normocephalic and atraumatic.  Cardiovascular: Normal rate, regular rhythm and normal heart sounds.   Pulmonary/Chest: Effort normal and breath sounds normal.  Neurological: She is alert and oriented to person, place, and time.  Skin: Skin is warm and dry.  Psychiatric: She has a normal mood and affect. Her behavior is normal.          Assessment & Plan:  HTN - REfill meds. Due for CMP. Well controlled. Continue current regimen. Followup in 6 months.  Vaccines updated.  Flu vaccine and tdap given today.  Multiple sclerosis-we will check her liver enzymes to make sure that everything looks K. on the Rebif. We'll be happy to send a copy of these to go for neurology. It looks like her upcoming appointment in October is with Dr. Vickey Huger . Actually think she will really like her but if she does not then she can certainly see Dr. Burnett Corrente at [facility] neurology or possibly Dr. Arbutus Leas at [facility] neurology.

## 2012-11-21 NOTE — Addendum Note (Signed)
Addended by: Deno Etienne on: 11/21/2012 01:08 PM   Modules accepted: Orders

## 2012-11-22 LAB — COMPLETE METABOLIC PANEL WITH GFR
AST: 48 U/L — ABNORMAL HIGH (ref 0–37)
Alkaline Phosphatase: 65 U/L (ref 39–117)
BUN: 15 mg/dL (ref 6–23)
Creat: 0.74 mg/dL (ref 0.50–1.10)
GFR, Est Non African American: >89 mL/min
Glucose, Bld: 103 mg/dL — ABNORMAL HIGH (ref 70–99)
Total Bilirubin: 0.5 mg/dL (ref 0.3–1.2)

## 2012-11-22 LAB — LIPID PANEL
Cholesterol: 152 mg/dL (ref 0–200)
Total CHOL/HDL Ratio: 3.6 Ratio
Triglycerides: 103 mg/dL (ref <150)
VLDL: 21 mg/dL (ref 0–40)

## 2012-11-27 ENCOUNTER — Telehealth: Payer: Self-pay | Admitting: *Deleted

## 2012-11-27 DIAGNOSIS — I10 Essential (primary) hypertension: Secondary | ICD-10-CM

## 2012-11-27 DIAGNOSIS — R748 Abnormal levels of other serum enzymes: Secondary | ICD-10-CM

## 2012-11-27 NOTE — Telephone Encounter (Signed)
Repeat of cmp.Alyssa Bass [place]

## 2012-12-07 ENCOUNTER — Telehealth: Payer: Self-pay

## 2012-12-07 NOTE — Telephone Encounter (Signed)
Primarily, just needs to avoid Tylenol products. Can use things like Aleve or ibuprofen or aspirin.

## 2012-12-07 NOTE — Telephone Encounter (Signed)
Left message

## 2012-12-07 NOTE — Telephone Encounter (Signed)
Alyssa Bass left a message on voice mail stating:  She wants to know what pain medication she can take for her headaches. She knows she cannot take certain once due to her elevated liver.

## 2013-01-01 ENCOUNTER — Other Ambulatory Visit: Payer: Self-pay | Admitting: *Deleted

## 2013-01-01 DIAGNOSIS — R748 Abnormal levels of other serum enzymes: Secondary | ICD-10-CM

## 2013-01-01 DIAGNOSIS — I10 Essential (primary) hypertension: Secondary | ICD-10-CM

## 2013-01-01 LAB — COMPREHENSIVE METABOLIC PANEL
AST: 24 U/L (ref 0–37)
Albumin: 3.9 g/dL (ref 3.5–5.2)
Alkaline Phosphatase: 58 U/L (ref 39–117)
BUN: 14 mg/dL (ref 6–23)
Calcium: 9.2 mg/dL (ref 8.4–10.5)
Creat: 0.76 mg/dL (ref 0.50–1.10)
Glucose, Bld: 109 mg/dL — ABNORMAL HIGH (ref 70–99)
Potassium: 4.8 mEq/L (ref 3.5–5.3)

## 2013-01-04 ENCOUNTER — Other Ambulatory Visit: Payer: Self-pay | Admitting: Family Medicine

## 2013-01-04 MED ORDER — TRAMADOL HCL 50 MG PO TABS: 50.0000 mg | ORAL_TABLET | Freq: Two times a day (BID) | ORAL | Status: DC | PRN

## 2013-01-24 ENCOUNTER — Other Ambulatory Visit: Payer: Self-pay | Admitting: Neurology

## 2013-02-15 ENCOUNTER — Other Ambulatory Visit: Payer: Self-pay

## 2013-02-15 MED ORDER — INTERFERON BETA-1A 44 MCG/0.5ML ~~LOC~~ SOLN: 44.0000 ug | SUBCUTANEOUS | Status: DC

## 2013-02-15 NOTE — Telephone Encounter (Signed)
[facility] patient assigned to Dr Dohmeier  

## 2013-02-20 ENCOUNTER — Other Ambulatory Visit: Payer: Self-pay

## 2013-02-20 MED ORDER — INTERFERON BETA-1A 44 MCG/0.5ML ~~LOC~~ SOLN: 44.0000 ug | SUBCUTANEOUS | Status: DC

## 2013-03-18 ENCOUNTER — Other Ambulatory Visit: Payer: Self-pay | Admitting: Family Medicine

## 2013-04-11 ENCOUNTER — Other Ambulatory Visit: Payer: Self-pay | Admitting: Family Medicine

## 2013-04-17 ENCOUNTER — Encounter (INDEPENDENT_AMBULATORY_CARE_PROVIDER_SITE_OTHER): Payer: Self-pay

## 2013-04-17 ENCOUNTER — Encounter: Payer: Self-pay | Admitting: Neurology

## 2013-04-17 ENCOUNTER — Ambulatory Visit (INDEPENDENT_AMBULATORY_CARE_PROVIDER_SITE_OTHER): Payer: Commercial Managed Care - PPO | Admitting: Neurology

## 2013-04-17 VITALS — BP 112/77 | HR 105 | Resp 16 | Ht 63.0 in | Wt 218.0 lb

## 2013-04-17 DIAGNOSIS — R5381 Other malaise: Secondary | ICD-10-CM | POA: Insufficient documentation

## 2013-04-17 DIAGNOSIS — G35 Multiple sclerosis: Secondary | ICD-10-CM

## 2013-04-17 DIAGNOSIS — R000 Tachycardia, unspecified: Secondary | ICD-10-CM | POA: Insufficient documentation

## 2013-04-17 DIAGNOSIS — E669 Obesity, unspecified: Secondary | ICD-10-CM | POA: Insufficient documentation

## 2013-04-17 MED ORDER — AMPHETAMINE-DEXTROAMPHETAMINE 12.5 MG PO TABS: 12.5000 mg | ORAL_TABLET | Freq: Every day | ORAL | Status: DC

## 2013-04-17 MED ORDER — DIMETHYL FUMARATE 120 & 240 MG PO MISC: 120.0000 mg | Freq: Two times a day (BID) | ORAL | Status: DC

## 2013-04-17 NOTE — Patient Instructions (Signed)
Multiple Sclerosis Multiple sclerosis (MS) is a disease of the central nervous system. Its cause is unknown. It is more common in the northern states than in the southern states. There is a higher incidence of MS in women. There is a wide variation in the symptoms (problems) of MS. This is because of the many different ways it affects the central nervous system. It often comes on in episodes or attacks. These attacks may last weeks to months. There may be long periods of nearly no problems between attacks. The main symptoms include visual problems (associated with eye pain), numbness, weakness, and paralysis in extremities (arms/hands and legs/feet). There may also be tremors and problems with balance and walking. The age when MS starts is variable. Advances in medicine continue to improve the treatment of this illness. There is no known cure for MS but there are medications that help. MS is not an inherited illness, although your risk of getting this disease is higher if you have a relative with MS. The best radiologic (x-ray) study for MS is an MRI (magnetic resonance imaging). There are medications available to decrease the number and frequency of attacks. SYMPTOMS  The symptoms of MS are caused by loss of insulation (myelin) of the nerves of the brain. When this happens, brain signals do not get transmitted properly or may not get transmitted at all. Some of the problems caused by this include:   Numbness.  Weakness.  Paralysis in extremities.  Visual problems, eye pain.  Balance problems.  Tremors. DIAGNOSIS  Your caregiver can do studies on you to make this diagnosis. This may include specialized X-rays and spinal fluid studies. HOME CARE INSTRUCTIONS   Take medications as directed by your caregiver. Baclofen is a drug commonly used to reduce muscle spasticity. Steroids are often used for short term relief.  Exercise as directed.  Use physical and occupational therapy as directed by  your caregiver. Careful attention to this medical care can help avoid depression.  See your caregiver if you begin to have problems with depression. This is a common problem in MS. Patients often continue to work many years after the diagnosis of MS. Document Released: 06/24/2000 Document Revised: 09/19/2011 Document Reviewed: 01/31/2007 [employer] Patient Information 2014 [employer].  

## 2013-04-17 NOTE — Progress Notes (Addendum)
[facility]  Provider:  Melvyn Novas, M D  Referring Provider: Agapito Games, * Primary Care Physician:  Alyssa Gasser, MD  Chief Complaint  Patient presents with  . NP    Transfer of care (Dr. Sandria Manly) Vision was taken today as well Rt eye was 20/30 Lt eye was 20/40 MOCA was given as well score is notated at bottom of test    HPI:  Alyssa Bass is a 47 y.o. female  Is seen here as a referral  from Dr. Linford Arnold for unknown/ unspecified  Reason, was supposed to see NP. The patient has Multiple sclerosis since 1997, after delivering a healthy baby daughter. A spinal tap had revealed only one band, but she developed optic neuritis in 2000, when she developed sensory symptoms.  A repeat CSF test revealed than in 2000 the oligoclonal bands and established the diagnosis.    Alyssa Bass, a former patient of Dr. Avie Echevaria,  has been followed by him for her multiple sclerosis- but she also carries a diagnosis of an  AVM and intra-ventricular cyst.  To  screen for progression in both diseases she has undergone regular MRIs. She is also followed at [facility] where she was seen, from where the more recent imaging studies are available .  I have a copy here of her last MRI from 9 2012 showing that the patient has multiple foci of periventricular and deep white matter lesions but predominantly perivascular. Her  white matter disease has gradually progressed since the  Baseline image and examination in 2002.  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  There was also no significant change in the left frontal venous angioma.    Review of Systems: Out of a complete 14 system review, the patient complains of only the following symptoms, and all other reviewed systems are negative. rebif  Causes headaches, she takes Advil every 4 hours during the day of injection.  Skin  reaction. REBIF. reinject did not help .  She had elevated LFT.  Obesity.   History   Social History  . Marital Status: Married    Spouse Name: N/A    Number of Children: 2  . Years of Education: 14   Occupational History  .      Psychologist, forensic   Social History Main Topics  . Smoking status: Former Games developer  . Smokeless tobacco: Not on file  . Alcohol Use: Yes  . Drug Use: No  . Sexual Activity:    Other Topics Concern  . Not on file   Social History Narrative   Pt is Rt handed as well as has college degree Pt is married as well as has children.Pt does take in at least 1 cup of coffee    Family History  Problem Relation Age of Onset  . Cancer      colon  . Heart attack      grandmother  . Hypertension Brother   . Hypertension Mother   . Sleep apnea Mother   . Sleep apnea Brother   . Sleep apnea Sister   . Cancer Father     pre cancerous colon    Past Medical History  Diagnosis Date  . 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 Hypertension   . Depression   . MS (multiple sclerosis)   . Tachycardia  Past Surgical History  Procedure Laterality Date  . Achilles tendon surgery      Hagland deformity  . Left ankle surgery  2004  . Colonoscopy w/ polypectomy      Current Outpatient Prescriptions  Medication Sig Dispense Refill  . Calcium Carbonate (CALTRATE 600) 1500 MG TABS Take by mouth daily.        . interferon beta-1a (REBIF) 44 MCG/0.5ML injection Inject 0.5 mL (44 mcg total) into the skin 3 (three) times a week.  6 mL  1  . lisinopril-hydrochlorothiazide (PRINZIDE,ZESTORETIC) 20-12.5 MG per tablet TAKE 1 TABLET DAILY  90 tablet  1  . sertraline (ZOLOFT) 50 MG tablet TAKE 1 TABLET DAILY (NEED TO BE SEEN)  90 tablet  1  . traMADol (ULTRAM) 50 MG tablet TAKE 1 TABLET (50 MG TOTAL) BY MOUTH 2 (TWO) TIMES DAILY AS NEEDED FOR PAIN.  60 tablet  0  . fluticasone (FLONASE) 50 MCG/ACT nasal spray Place 2 sprays into the nose daily.      [name] Kitchen levocetirizine  (XYZAL) 5 MG tablet Take 1 tablet by mouth at bedtime.      . Olopatadine HCl 0.2 % SOLN Apply 1 drop to eye daily.  1 Bottle  0   No current facility-administered medications for this visit.    Allergies as of 04/17/2013  . (No Known Allergies)    Vitals: BP 112/77  Pulse 105  Resp 16  Ht 5\' 3"  (1.6 m)  Wt 218 lb (98.884 kg)  BMI 38.63 kg/m2 Last Weight:  Wt Readings from Last 1 Encounters:  04/17/13 218 lb (98.884 kg)   Last Height:   Ht Readings from Last 1 Encounters:  04/17/13 5\' 3"  (1.6 m)    Physical exam:  General: The patient is awake, alert and appears not in acute distress. The patient is well groomed. Head: Normocephalic, atraumatic. Neck is supple. Mallampati 3 , neck circumference: 15.25. Cardiovascular:  Regular rate and rhythm, without  murmurs or carotid bruit, and without distended neck veins. Respiratory: Lungs are clear to auscultation. Skin:  Without evidence of edema, or rash Trunk: BMI is  elevated . The patient  has normal posture.  Neurologic exam : The patient is awake and alert, oriented to place and time.  Memory subjective  described as impaired . There is a normal attention span & concentration ability.MOCA 30-30.  She noticed problems focussing ,  Speech is fluent without dysarthria, dysphonia or aphasia. Mood and affect are der pressed, frustrated. .  Cranial nerves: Pupils are equal and briskly reactive to light. Funduscopic exam without  evidence of pallor or edema. Extraocular movements  in vertical and horizontal planes intact and without nystagmus. Her left eye seems to move further inward with conjunction  than the right, but she deieis diplopia.   Visual fields by finger perimetry are intact. Hearing to finger rub intact.  Facial sensation intact to fine touch. Facial motor strength is symmetric and tongue and uvula move midline.  Motor exam:   Normal tone and normal muscle bulk and symmetric normal strength in all  extremities.  Sensory:  Fine touch, pinprick and vibration were tested in all extremities. Proprioception is  Normal. Right hand fingertips are numb to fine filament .  Coordination: Rapid alternating movements in the fingers/hands is tested and normal. Finger-to-nose maneuver tested and normal without evidence of ataxia, dysmetria  but  Tremor close to target..  Gait and station: Patient walks without assistive device . Strength within normal limits. Stance is stable  and normal. Tandem gait is  unfragmented. Romberg testing is  normal.  Deep tendon reflexes: in the  upper and lower extremities are symmetric and intact. Babinski maneuver response is  downgoing.   Assessment:  After physical and neurologic examination, review of laboratory studies, imaging, neurophysiology testing and pre-existing records, assessment is  That of MS with initial manifestations following a pregnancy, optic neuritis and sensory loss, transient.  Rebif , with little progression of white matter lesions since 2008-2012, but  none recently .   Plan:  Treatment plan and additional workup : Patient needs JCV scan , varicella zoster.  Flu shot tomorrow before any immunomodulation changes are initiated. Stevan Born is best option for her,   I explained to the patient that Donnamarie Poag is an option for patients attest negative for the JC virus. However it is normally used for more aggressive forms of MS -  To initiate  Gilenya therapy   A varicella-zoster virus AB status has to be checked as well and I would like her to keep her options open.  Discussed Aubagio, but this  is not an attractive choice for this patient. Tysabri should still be considered , should she not tolerate the oral medication.  Seems to have elevated her liver enzymes but this could also occur with the oral medications for the treatment of a mass. I will obtain a new liver function panel, a CBC with differential, JC virus and varicella-zoster virus. She  will stay for now on Rebif.  When she returns from her trip to [facility] in late October we can leave the decision to change her to other medications from rebirth on the lap results. The patient mainly has nuchal fatigued and has injection site reactions as well as DD flulike symptoms and has been taking nonsteroidals every day of rebirth injection. On the other hand she has not had a major relapse since being on the drug. aubagio- gall bladder problems,  gilenya entry barrier is too high.

## 2013-04-18 LAB — COMPREHENSIVE METABOLIC PANEL
ALT: 55 IU/L — ABNORMAL HIGH (ref 0–32)
AST: 38 IU/L (ref 0–40)
CO2: 24 mmol/L (ref 18–29)
Calcium: 9.7 mg/dL (ref 8.7–10.2)
Chloride: 95 mmol/L — ABNORMAL LOW (ref 97–108)
Potassium: 4.3 mmol/L (ref 3.5–5.2)
Sodium: 136 mmol/L (ref 134–144)

## 2013-04-18 LAB — CBC
Platelets: 361 10*3/uL (ref 150–379)
RBC: 4.54 x10E6/uL (ref 3.77–5.28)
WBC: 7.3 10*3/uL (ref 3.4–10.8)

## 2013-04-18 LAB — LIPID PANEL
HDL: 49 mg/dL (ref 35–70)
LDL Cholesterol: 116 mg/dL
Triglycerides: 91 mg/dL (ref 40–160)

## 2013-04-18 LAB — BASIC METABOLIC PANEL: Glucose: 104 mg/dL

## 2013-04-22 ENCOUNTER — Telehealth: Payer: Self-pay

## 2013-04-22 NOTE — Telephone Encounter (Signed)
I called and left patient VM that labs were normal. Still awaiting results for JCV that go to another lab. We will call when those results come in. Please feel free to contact us with any questions.

## 2013-04-22 NOTE — Telephone Encounter (Signed)
Message copied by [facility] on Mon Apr 22, 2013 12:53 PM ------      Message from: [facility], CARMEN      Created: Mon Apr 22, 2013 12:35 PM       Please acll with normal labs.       ----- Message -----         From: SYSTEM         Sent: 04/22/2013  12:08 AM           To: Melvyn Novas, MD                   ------

## 2013-04-22 NOTE — Progress Notes (Signed)
Please call patient with normal metabolic panel. CD

## 2013-04-23 NOTE — Progress Notes (Signed)
i did not find the  Stratify test, negative test for varicella.  Please have a look for JCV.

## 2013-04-30 ENCOUNTER — Telehealth: Payer: Self-pay | Admitting: Neurology

## 2013-04-30 DIAGNOSIS — G35 Multiple sclerosis: Secondary | ICD-10-CM

## 2013-04-30 DIAGNOSIS — R5381 Other malaise: Secondary | ICD-10-CM

## 2013-04-30 DIAGNOSIS — G4714 Hypersomnia due to medical condition: Secondary | ICD-10-CM

## 2013-04-30 NOTE — Telephone Encounter (Signed)
Spoke to patient and relayed postitive JCV antibody.  She asked if the doctor could increase her Adderall to the next dose, she is taking 12.5 mg daily.  Also wanted to let doctor know that she got a call from [facility] and they are sending the Tecfidera on Thurs, but she will start the medication on Monday since she will be away this weekend.

## 2013-05-02 MED ORDER — AMPHETAMINE-DEXTROAMPHETAMINE 12.5 MG PO TABS: 25.0000 mg | ORAL_TABLET | Freq: Every day | ORAL | Status: DC

## 2013-05-02 NOTE — Telephone Encounter (Signed)
There has been one documented case with PLM now in [place] , for a patient on Tacfidera.  I would like for her to enroll in the observational study , and yes - I will increase the Adderall.

## 2013-05-13 ENCOUNTER — Telehealth: Payer: Self-pay | Admitting: Neurology

## 2013-05-14 ENCOUNTER — Telehealth: Payer: Self-pay | Admitting: Neurology

## 2013-05-14 NOTE — Progress Notes (Signed)
Quick Note:  I called pt and relayed the MRI results. (stable, no changes). She then asked about adderall prescription with increased dosing. I relayed to Vikki Ports in [place] and she will call pt back. ______

## 2013-05-14 NOTE — Telephone Encounter (Signed)
Call pt and left message that her prescription was ready to be picked up, and if there were any questions or concerns to call the office.

## 2013-05-16 ENCOUNTER — Other Ambulatory Visit: Payer: Self-pay

## 2013-05-16 DIAGNOSIS — G35 Multiple sclerosis: Secondary | ICD-10-CM

## 2013-05-21 MED ORDER — DIMETHYL FUMARATE 120 & 240 MG PO MISC: 240.0000 mg | Freq: Two times a day (BID) | ORAL | Status: DC

## 2013-05-27 ENCOUNTER — Other Ambulatory Visit: Payer: Self-pay

## 2013-05-27 ENCOUNTER — Telehealth: Payer: Self-pay | Admitting: Neurology

## 2013-05-27 DIAGNOSIS — G35D Multiple sclerosis, unspecified: Secondary | ICD-10-CM

## 2013-05-27 DIAGNOSIS — R5381 Other malaise: Secondary | ICD-10-CM

## 2013-05-27 DIAGNOSIS — G4714 Hypersomnia due to medical condition: Secondary | ICD-10-CM

## 2013-05-27 DIAGNOSIS — R5383 Other fatigue: Secondary | ICD-10-CM

## 2013-05-27 DIAGNOSIS — G35 Multiple sclerosis: Secondary | ICD-10-CM

## 2013-05-27 MED ORDER — DIMETHYL FUMARATE 120 & 240 MG PO MISC: 240.0000 mg | Freq: Two times a day (BID) | ORAL | Status: DC

## 2013-05-27 MED ORDER — AMPHETAMINE-DEXTROAMPHETAMINE 12.5 MG PO TABS: 25.0000 mg | ORAL_TABLET | Freq: Every day | ORAL | Status: DC

## 2013-05-27 NOTE — Telephone Encounter (Signed)
Called patient to let her know that her prescription was ready to be picked up, patient verbalized understanding.

## 2013-05-31 ENCOUNTER — Other Ambulatory Visit: Payer: Self-pay

## 2013-05-31 MED ORDER — LISINOPRIL-HYDROCHLOROTHIAZIDE 20-12.5 MG PO TABS: ORAL_TABLET | ORAL | Status: DC

## 2013-05-31 NOTE — Telephone Encounter (Signed)
Please advise 

## 2013-06-02 MED ORDER — DIMETHYL FUMARATE 240 MG PO CPDR: 240.0000 mg | DELAYED_RELEASE_CAPSULE | Freq: Every day | ORAL | Status: DC

## 2013-06-02 NOTE — Telephone Encounter (Signed)
Rx has been sent  

## 2013-06-05 ENCOUNTER — Telehealth: Payer: Self-pay

## 2013-06-05 NOTE — Telephone Encounter (Signed)
Patient called and said the pharmacy contacted her saying they need to speak to our office to verify her Rx.  She asked that we call them at [phone].  I called them, spoke with [place].  Verified we did send the Tecfidera 240mg  BID Rx.  They will continue processing order.  Said nothing further is needed at this time. I called the patient back to advise,  Got no answer.  Left message.

## 2013-06-10 ENCOUNTER — Ambulatory Visit: Payer: Commercial Managed Care - PPO | Admitting: Family Medicine

## 2013-06-10 ENCOUNTER — Telehealth: Payer: Self-pay | Admitting: Neurology

## 2013-06-10 NOTE — Telephone Encounter (Signed)
Tecfidera is a specialty med that cannot be prescribed at a local pharmacy.  I called the patient back.  She will follow up with the pharmacy, as shipment should be arriving.  I spoke with the pharmacy last week and they were to process meds.  Patient will call us back if anything further is needed.

## 2013-06-15 ENCOUNTER — Other Ambulatory Visit: Payer: Self-pay | Admitting: Neurology

## 2013-06-17 ENCOUNTER — Ambulatory Visit (INDEPENDENT_AMBULATORY_CARE_PROVIDER_SITE_OTHER): Payer: Commercial Managed Care - PPO | Admitting: Family Medicine

## 2013-06-17 ENCOUNTER — Encounter: Payer: Self-pay | Admitting: Family Medicine

## 2013-06-17 VITALS — BP 126/79 | HR 86 | Temp 97.0°F | Ht 64.0 in | Wt 225.0 lb

## 2013-06-17 DIAGNOSIS — I10 Essential (primary) hypertension: Secondary | ICD-10-CM

## 2013-06-17 DIAGNOSIS — F32A Depression, unspecified: Secondary | ICD-10-CM

## 2013-06-17 DIAGNOSIS — G4733 Obstructive sleep apnea (adult) (pediatric): Secondary | ICD-10-CM

## 2013-06-17 DIAGNOSIS — F329 Major depressive disorder, single episode, unspecified: Secondary | ICD-10-CM

## 2013-06-17 DIAGNOSIS — R635 Abnormal weight gain: Secondary | ICD-10-CM

## 2013-06-17 DIAGNOSIS — G35 Multiple sclerosis: Secondary | ICD-10-CM

## 2013-06-17 MED ORDER — LISINOPRIL-HYDROCHLOROTHIAZIDE 20-12.5 MG PO TABS: ORAL_TABLET | ORAL | Status: DC

## 2013-06-17 NOTE — Progress Notes (Signed)
Subjective:    Patient ID: Alyssa Bass, female    DOB: 22-Sep-1965, 47 y.o.   MRN: [id number]  HPI HTN-  Pt denies chest pain, SOB, dizziness, or heart palpitations.  Taking meds as directed w/o problems.  Denies medication side effects.    MS- Rebif was giving her a lot of HA and she is doing better off the medication. Seh is now on Tecfidera. Her neurologist also added Adderall to help give her little energy boost and says it has really made a big difference in her quality of life and is noticing her attitude has been a little bit better with having more energy.  Abnormal weight gain - She has had a hard time losing weight.  She feels like she eats a very healthy diet overall. She eats lots of leafy green vegetables and a quit and says eats a sensible dinner. She's been exercising regularly. She's having a hard time actually losing the weight. She feels that she has plateaued.  Depression-she says she was started on sertraline was 10 years ago with her last major flare with her MS. She says it has been helpful. She recently try to go off on and became very tearful. The she has decided to stay on it for now.  Review of Systems     BP 126/79  Pulse 86  Temp(Src) 97 F (36.1 C)  Ht 5\' 4"  (1.626 m)  Wt 225 lb (102.059 kg)  BMI 38.60 kg/m2    No Known Allergies  Past Medical History  Diagnosis Date  . 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 Hypertension   . Depression   . MS (multiple sclerosis)   . Tachycardia     Past Surgical History  Procedure Laterality Date  . Achilles tendon surgery      Hagland deformity  . Left ankle surgery  2004  . Colonoscopy w/ polypectomy      History   Social History  . Marital Status: Married    Spouse Name: N/A    Number of Children: 2  . Years of Education: 14   Occupational History  .      Psychologist, forensic   Social History Main Topics  . Smoking status: Former Games developer  . Smokeless tobacco: Not on file  . Alcohol Use: Yes   . Drug Use: No  . Sexual Activity:    Other Topics Concern  . Not on file   Social History Narrative   Pt is Rt handed as well as has college degree Pt is married as well as has children.Pt does take in at least 1 cup of coffee    Family History  Problem Relation Age of Onset  . Cancer      colon  . Heart attack      grandmother  . Hypertension Brother   . Hypertension Mother   . Sleep apnea Mother   . Sleep apnea Brother   . Sleep apnea Sister   . Cancer Father     pre cancerous colon    Outpatient Encounter Prescriptions as of 06/17/2013  Medication Sig  . amphetamine-dextroamphetamine (ADDERALL) 12.5 MG tablet Take 2 tablets (25 mg total) by mouth daily.  . Calcium Carbonate (CALTRATE 600) 1500 MG TABS Take by mouth daily.    . Dimethyl Fumarate (TECFIDERA) 240 MG CPDR Take 1 capsule (240 mg total) by mouth daily.  . fluticasone (FLONASE) 50 MCG/ACT nasal spray Place 2 sprays into the nose  daily.  . levocetirizine (XYZAL) 5 MG tablet Take 1 tablet by mouth at bedtime.  [name] Kitchen lisinopril-hydrochlorothiazide (PRINZIDE,ZESTORETIC) 20-12.5 MG per tablet TAKE 1 TABLET DAILY  . Olopatadine HCl 0.2 % SOLN Apply 1 drop to eye daily.  . sertraline (ZOLOFT) 50 MG tablet Take 1 tablet (50 mg total) by mouth daily.  . traMADol (ULTRAM) 50 MG tablet TAKE 1 TABLET (50 MG TOTAL) BY MOUTH 2 (TWO) TIMES DAILY AS NEEDED FOR PAIN.  . [DISCONTINUED] lisinopril-hydrochlorothiazide (PRINZIDE,ZESTORETIC) 20-12.5 MG per tablet TAKE 1 TABLET DAILY  . [DISCONTINUED] lisinopril-hydrochlorothiazide (PRINZIDE,ZESTORETIC) 20-12.5 MG per tablet TAKE 1 TABLET DAILY  . [DISCONTINUED] interferon beta-1a (REBIF) 44 MCG/0.5ML injection Inject 0.5 mL (44 mcg total) into the skin 3 (three) times a week.  . [DISCONTINUED] sertraline (ZOLOFT) 50 MG tablet TAKE 1 TABLET DAILY (NEED TO BE SEEN)       Objective:   Physical Exam  Constitutional: She is oriented to person, place, and time. She appears  well-developed and well-nourished.  HENT:  Head: Normocephalic and atraumatic.  Cardiovascular: Normal rate, regular rhythm and normal heart sounds.   Pulmonary/Chest: Effort normal and breath sounds normal.  Neurological: She is alert and oriented to person, place, and time.  Skin: Skin is warm and dry.  Psychiatric: She has a normal mood and affect. Her behavior is normal.          Assessment & Plan:  HTN- Well controlled.  F/U in 6 months. Call if any problems. Refill sent to pharmacy.  MS-she's a little happier with her current regimen. Also now on Adderall. Blood pressure still looks well controlled.  Abnormal weight gain-offered to refer her for nutrition counseling. I feel like even with a healthy diet she is eating that this could be helpful. She may not be eating enough calories or the right portions of things. She can certainly check with her insurance if it's covered please call the office back and we have to make referral. We'll also check a TSH just to make sure that her thyroid levels are normal. She does have sleep apnea but uses her CPAP regularly.  Depression-well-controlled with sertraline. Explained her that at some point she would like to wean off of it it can be tapered very slowly over several months if needed to make sure that she tolerates that well.

## 2013-06-18 ENCOUNTER — Encounter: Payer: Self-pay | Admitting: *Deleted

## 2013-06-18 ENCOUNTER — Telehealth: Payer: Self-pay | Admitting: Family Medicine

## 2013-06-18 DIAGNOSIS — R635 Abnormal weight gain: Secondary | ICD-10-CM

## 2013-06-18 DIAGNOSIS — I10 Essential (primary) hypertension: Secondary | ICD-10-CM

## 2013-06-18 MED ORDER — LISINOPRIL-HYDROCHLOROTHIAZIDE 20-12.5 MG PO TABS: ORAL_TABLET | ORAL | Status: DC

## 2013-06-18 NOTE — Telephone Encounter (Signed)
Patient sent fax: Her insurance plan does cover nutrition counseling.  Please call patient back and let her know Referral placed to the diabetes nutrition Center in [place]. They should be contacting her in the next week. She has not heard from them within 5 business days then please call her office back. Patellar thank you for sending me her results as well.

## 2013-06-18 NOTE — Telephone Encounter (Signed)
Pt notified.Alyssa Bass Lynetta  

## 2013-06-19 NOTE — Progress Notes (Signed)
Quick Note:  All labs are normal. ______ 

## 2013-07-05 ENCOUNTER — Encounter: Payer: Self-pay | Admitting: *Deleted

## 2013-07-18 ENCOUNTER — Telehealth: Payer: Self-pay

## 2013-07-18 NOTE — Telephone Encounter (Signed)
Called patient to reschedule 07/19/13 appt w/ NP-Lam because Dr. Brett Fairy will not be in the office. Rescheduled appt for 08/05/13. Patient request Adderall prescription refilled because going on vacation before 08/05/13 appointment. She would like to p/u Rx on 07/19/13.

## 2013-07-19 ENCOUNTER — Ambulatory Visit: Payer: Commercial Managed Care - PPO | Admitting: Nurse Practitioner

## 2013-07-22 NOTE — Telephone Encounter (Signed)
Alyssa Bass or Dr Dohmier to address

## 2013-07-23 ENCOUNTER — Telehealth: Payer: Self-pay | Admitting: Neurology

## 2013-07-23 ENCOUNTER — Other Ambulatory Visit: Payer: Self-pay

## 2013-07-23 DIAGNOSIS — R5383 Other fatigue: Secondary | ICD-10-CM

## 2013-07-23 DIAGNOSIS — G35 Multiple sclerosis: Secondary | ICD-10-CM

## 2013-07-23 DIAGNOSIS — R5381 Other malaise: Secondary | ICD-10-CM

## 2013-07-23 DIAGNOSIS — G4714 Hypersomnia due to medical condition: Secondary | ICD-10-CM

## 2013-07-23 MED ORDER — AMPHETAMINE-DEXTROAMPHETAMINE 12.5 MG PO TABS: 25.0000 mg | ORAL_TABLET | Freq: Every day | ORAL | Status: DC

## 2013-07-23 NOTE — Telephone Encounter (Signed)
Patient called requesting a refill on Adderall.  She would like a call back at [phone] when it's ready for pick up.   Dr Dohmeier is out of the office, forwarding request to [facility].

## 2013-07-23 NOTE — Telephone Encounter (Signed)
We did not get a message last week.  This has already been addressed by [facility].  Tye Maryland or Mateo Flow will call patient when Rx is signed. I called the patient.  She is aware.

## 2013-07-23 NOTE — Telephone Encounter (Signed)
I called and let patient know Rx ready for pick up.

## 2013-08-05 ENCOUNTER — Telehealth: Payer: Self-pay | Admitting: Nurse Practitioner

## 2013-08-05 ENCOUNTER — Ambulatory Visit: Payer: Self-pay | Admitting: Nurse Practitioner

## 2013-08-05 NOTE — Telephone Encounter (Signed)
Patient was no show for office appointment today. 

## 2013-08-14 ENCOUNTER — Encounter: Payer: Self-pay | Admitting: Dietician

## 2013-08-14 ENCOUNTER — Encounter: Payer: Commercial Managed Care - PPO | Attending: Family Medicine | Admitting: Dietician

## 2013-08-14 VITALS — Ht 64.0 in | Wt 220.0 lb

## 2013-08-14 DIAGNOSIS — Z87891 Personal history of nicotine dependence: Secondary | ICD-10-CM | POA: Insufficient documentation

## 2013-08-14 DIAGNOSIS — E669 Obesity, unspecified: Secondary | ICD-10-CM

## 2013-08-14 DIAGNOSIS — Z713 Dietary counseling and surveillance: Secondary | ICD-10-CM | POA: Insufficient documentation

## 2013-08-14 DIAGNOSIS — Z6837 Body mass index (BMI) 37.0-37.9, adult: Secondary | ICD-10-CM | POA: Insufficient documentation

## 2013-08-14 NOTE — Patient Instructions (Signed)
Weigh yourself 1 x week or less (put the scale out of the bathroom). Add snacks with protein if you are hungry.  Consider having a [facility] Protein bar (protein + carbs) or yogurt for a snack. Add healthy fat (olive oil, avodaco, nuts, seeds) to salad to help absorb fat soluble vitamins.  Add some carbs to lunch and dinner to help you feel satisfied. (fruit, whole grains) Listen to your body and eat when you are hungry.  Losing an average of .5 lb a week is considered.

## 2013-08-14 NOTE — Progress Notes (Signed)
  Medical Nutrition Therapy:  Appt start time: 1130 end time:  1220.  Assessment:  Primary concerns today: Alyssa Bass is here today since has been having trouble losing weight even though she eats well, exercises, and is taking Adderall (since October). Taking Adderall for energy for MS. States that she has always had a problem with her weight, though has lost weight in the past by cutting out carbs. Has also tried Weight Watchers, [place], [place], [place] [employer]. Has always exercised.   Feels like her weight is "creeping" and gained about 30 lbs since 2005 when she had foot surgery and quit smoking.   Alyssa Bass lives with her husband and youngest daughter and does the food shopping and preparation at home. 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  Most recently cut out fruit and yogurt for snacks and lost and stopped drinking alcohol during the week about 5 lbs over the last few months. Does still have some alcohol on the weekend and will go out to dinner on the weekend. States that she is feeling hungry throughout the day.   Wt Readings from Last 3 Encounters:  08/14/13 220 lb (99.791 kg)  06/17/13 225 lb (102.059 kg)  04/17/13 218 lb (98.884 kg)   Ht Readings from Last 3 Encounters:  08/14/13 5\' 4"  (1.626 m)  06/17/13 5\' 4"  (1.626 m)  04/17/13 5\' 3"  (1.6 m)   Body mass index is 37.74 kg/(m^2). @BMIFA @ Normalized weight-for-age data available only for age 69 to 59 years. Normalized stature-for-age data available only for age 69 to 98 years.    Preferred Learning Style:   Auditory  Visual  Hands on  Learning Readiness:   Ready   MEDICATIONS: see list   DIETARY INTAKE:  Avoided foods include: bread, pasta   24-hr recall:  B ( AM): egg and 2 egg whites with spinach with cheese with coffee + creamer   Snk ( AM): none  L ( PM): spinach with grilled chicken, avocado, cucumber, tomatoes, with vinegar  Snk ( PM): none D ( PM): grill pork tenderloin, chicken, steak with beans or  asparagus Snk ( PM): none Beverages: coffee and water most of the time  Usual physical activity: circuit training 2-3 x week, cardio (elliptical and treadmill) for 45-60 minutes 3 x week (5 days total)  Estimated energy needs: 1800 calories 200 g carbohydrates 135 g protein 50 g fat  Progress Towards Goal(s):  In progress.   Nutritional Diagnosis:  [place]-3.3 Overweight/obesity As related to hx of weight fluctuations stemming from chronic dietiting.  As evidenced by BMI of 37.8.    Intervention:  Nutrition counseling provided.  Plan: Weigh yourself 1 x week or less (put the scale out of the bathroom). Add snacks with protein if you are hungry.  Consider having a [facility] Protein bar (protein + carbs) or yogurt for a snack. Add healthy fat (olive oil, avodaco, nuts, seeds) to salad to help absorb fat soluble vitamins.  Add some carbs to lunch and dinner to help you feel satisfied. (fruit, whole grains) Listen to your body and eat when you are hungry.  Losing an average of .5 lb a week is considered.   Teaching Method Utilized:  Visual Auditory Hands on  Handouts given during visit include:  MyPlate Handout  15 g CHO Snacks  Barriers to learning/adherence to lifestyle change: none  Demonstrated degree of understanding via:  Teach Back   Monitoring/Evaluation:  Dietary intake, exercise, and body weight prn.

## 2013-08-22 ENCOUNTER — Other Ambulatory Visit: Payer: Self-pay | Admitting: Neurology

## 2013-08-22 DIAGNOSIS — G4714 Hypersomnia due to medical condition: Secondary | ICD-10-CM

## 2013-08-22 DIAGNOSIS — G35 Multiple sclerosis: Secondary | ICD-10-CM

## 2013-08-22 DIAGNOSIS — R5383 Other fatigue: Secondary | ICD-10-CM

## 2013-08-22 DIAGNOSIS — R5381 Other malaise: Secondary | ICD-10-CM

## 2013-08-22 MED ORDER — AMPHETAMINE-DEXTROAMPHETAMINE 12.5 MG PO TABS: 25.0000 mg | ORAL_TABLET | Freq: Every day | ORAL | Status: DC

## 2013-08-22 NOTE — Telephone Encounter (Signed)
Patient requesting refill of Adderall

## 2013-08-23 NOTE — Telephone Encounter (Signed)
Called patient and left message informing her that her Rx was ready to be picked up at the front desk and if she has any other problems, questions or concerns to call the office.

## 2013-09-06 ENCOUNTER — Other Ambulatory Visit: Payer: Self-pay | Admitting: *Deleted

## 2013-09-06 MED ORDER — TRAMADOL HCL 50 MG PO TABS: ORAL_TABLET | ORAL | Status: DC

## 2013-09-06 MED ORDER — LEVOCETIRIZINE DIHYDROCHLORIDE 5 MG PO TABS: 5.0000 mg | ORAL_TABLET | Freq: Every day | ORAL | Status: DC

## 2013-09-06 MED ORDER — FLUTICASONE PROPIONATE 50 MCG/ACT NA SUSP: 2.0000 | Freq: Every day | NASAL | Status: DC

## 2013-09-24 ENCOUNTER — Ambulatory Visit: Payer: Commercial Managed Care - PPO | Admitting: Family Medicine

## 2013-09-26 ENCOUNTER — Other Ambulatory Visit: Payer: Self-pay | Admitting: Neurology

## 2013-09-26 DIAGNOSIS — R5383 Other fatigue: Secondary | ICD-10-CM

## 2013-09-26 DIAGNOSIS — G4714 Hypersomnia due to medical condition: Secondary | ICD-10-CM

## 2013-09-26 DIAGNOSIS — Z0289 Encounter for other administrative examinations: Secondary | ICD-10-CM

## 2013-09-26 DIAGNOSIS — G35 Multiple sclerosis: Secondary | ICD-10-CM

## 2013-09-26 DIAGNOSIS — R5381 Other malaise: Secondary | ICD-10-CM

## 2013-09-26 NOTE — Telephone Encounter (Signed)
Pt needs Adderall refill. 

## 2013-09-26 NOTE — Telephone Encounter (Signed)
Patient has rescheduled appt.  Dr Brett Fairy is out of the office.  Forwarding request to [facility].

## 2013-09-27 MED ORDER — AMPHETAMINE-DEXTROAMPHETAMINE 12.5 MG PO TABS: 25.0000 mg | ORAL_TABLET | Freq: Every day | ORAL | Status: DC

## 2013-09-27 NOTE — Telephone Encounter (Signed)
Patient was notified that the rx is ready for pickup at the front desk.  Patient verbalized understanding.

## 2013-11-01 ENCOUNTER — Other Ambulatory Visit: Payer: Self-pay | Admitting: *Deleted

## 2013-11-01 DIAGNOSIS — R5381 Other malaise: Secondary | ICD-10-CM

## 2013-11-01 DIAGNOSIS — R5383 Other fatigue: Secondary | ICD-10-CM

## 2013-11-01 DIAGNOSIS — G4714 Hypersomnia due to medical condition: Secondary | ICD-10-CM

## 2013-11-01 DIAGNOSIS — G35 Multiple sclerosis: Secondary | ICD-10-CM

## 2013-11-01 NOTE — Telephone Encounter (Signed)
Request has been sent to provider for approval 

## 2013-11-01 NOTE — Telephone Encounter (Signed)
Patient needing refill on amphetamine-dextroamphetamine (ADDERALL) 12.5 MG tablet.  thanks

## 2013-11-04 ENCOUNTER — Other Ambulatory Visit: Payer: Self-pay | Admitting: Neurology

## 2013-11-04 MED ORDER — AMPHETAMINE-DEXTROAMPHETAMINE 12.5 MG PO TABS: 25.0000 mg | ORAL_TABLET | Freq: Every day | ORAL | Status: DC

## 2013-11-04 NOTE — Telephone Encounter (Signed)
Called pt to inform her that her Rx was ready to be picked up at the front desk and if she has any other problems, questions or concerns to call the office. Pt verbalized understanding. °

## 2013-11-08 ENCOUNTER — Ambulatory Visit (INDEPENDENT_AMBULATORY_CARE_PROVIDER_SITE_OTHER): Payer: Commercial Managed Care - PPO | Admitting: Nurse Practitioner

## 2013-11-08 ENCOUNTER — Encounter: Payer: Self-pay | Admitting: Nurse Practitioner

## 2013-11-08 VITALS — BP 120/80 | HR 80 | Ht 64.0 in | Wt 218.0 lb

## 2013-11-08 DIAGNOSIS — R5381 Other malaise: Secondary | ICD-10-CM

## 2013-11-08 DIAGNOSIS — G4733 Obstructive sleep apnea (adult) (pediatric): Secondary | ICD-10-CM

## 2013-11-08 DIAGNOSIS — R5383 Other fatigue: Secondary | ICD-10-CM

## 2013-11-08 DIAGNOSIS — Z9989 Dependence on other enabling machines and devices: Secondary | ICD-10-CM

## 2013-11-08 DIAGNOSIS — G35 Multiple sclerosis: Secondary | ICD-10-CM

## 2013-11-08 DIAGNOSIS — G4714 Hypersomnia due to medical condition: Secondary | ICD-10-CM

## 2013-11-08 MED ORDER — AMPHETAMINE-DEXTROAMPHETAMINE 12.5 MG PO TABS: 25.0000 mg | ORAL_TABLET | Freq: Two times a day (BID) | ORAL | Status: DC

## 2013-11-08 MED ORDER — AMPHETAMINE-DEXTROAMPHETAMINE 12.5 MG PO TABS: 25.0000 mg | ORAL_TABLET | Freq: Every day | ORAL | Status: DC

## 2013-11-08 NOTE — Patient Instructions (Signed)
Increase Adderall to 25 mg, 2 tablets, twice daily.  3 months of Rx given.  Check labs today. CMP, CBC w diff  Continue Tecfidera.  Follow up in 6 months, sooner as needed.

## 2013-11-08 NOTE — Progress Notes (Signed)
PATIENT: Alyssa Bass DOB: 25-Dec-1965  REASON FOR VISIT: routine MS follow up HISTORY FROM: patient  HISTORY OF PRESENT ILLNESS: Alyssa Bass is a 48 y.o. With  Multiple sclerosis since 1997.  Update 11/08/13 (LL): Alyssa Bass returns for followup, she has been on Tecfidera for about 4 months and loves it.  She has a short episode of flushing at 11 am daily, but that is the only side effect. She has been doing well, except still very fatigued; she takes 50 mg adderall in the morning, and 25 mg after lunch on some days, and is very tired in the afternoons and must go to bed by 9 pm each night. She wears cpap each night and previously saw a MD at [facility] in [place] for this; the office has since closed, and she wishes to to be managed by Dr. Brett Fairy since she already comes to out office for MS.  PRIOR HPI (CD):  after delivering a healthy baby daughter. A spinal tap had revealed only one band, but she developed optic neuritis in 2000, when she developed sensory symptoms.  A repeat CSF test revealed than in 2000 the oligoclonal bands and established the diagnosis.  Alyssa Bass, a former patient of Dr. Morene Antu, has been followed by him for her multiple sclerosis- but she also carries a diagnosis of an AVM and intra-ventricular cyst.  To screen for progression in both diseases she has undergone regular MRIs. She is also followed at [facility] where she was seen, from where the more recent imaging studies are available .  I have a copy here of her last MRI from 9 2012 showing that the patient has multiple foci of periventricular and deep white matter lesions but predominantly perivascular.  Her white matter disease has gradually progressed since the Baseline image and examination in 2002.  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  There was also no significant change in the left frontal venous  angioma.  She has had elevated LFTs in the past, and a positive JC Virus antibody screen.  REVIEW OF SYSTEMS: Full 14 system review of systems performed and notable only for:  Flushing, Env allergies  ALLERGIES: No Known Allergies  HOME MEDICATIONS: Outpatient Prescriptions Prior to Visit  Medication Sig Dispense Refill  . Calcium Carbonate (CALTRATE 600) 1500 MG TABS Take by mouth daily.        . Dimethyl Fumarate (TECFIDERA) 240 MG CPDR Take 1 capsule (240 mg total) by mouth daily.  180 capsule  1  . fluticasone (FLONASE) 50 MCG/ACT nasal spray Place 2 sprays into both nostrils daily.  16 g  1  . levocetirizine (XYZAL) 5 MG tablet Take 1 tablet (5 mg total) by mouth at bedtime.  30 tablet  1  . lisinopril-hydrochlorothiazide (PRINZIDE,ZESTORETIC) 20-12.5 MG per tablet TAKE 1 TABLET DAILY  30 tablet  0  . Olopatadine HCl 0.2 % SOLN Apply 1 drop to eye daily.  1 Bottle  0  . sertraline (ZOLOFT) 50 MG tablet TAKE 1 TABLET DAILY  90 tablet  0  . traMADol (ULTRAM) 50 MG tablet TAKE 1 TABLET (50 MG TOTAL) BY MOUTH 2 (TWO) TIMES DAILY AS NEEDED FOR PAIN.  60 tablet  0  . amphetamine-dextroamphetamine (ADDERALL) 12.5 MG tablet Take 2 tablets (25 mg total) by mouth daily.  60 tablet  0   No  facility-administered medications prior to visit.     PHYSICAL EXAM  Filed Vitals:   11/08/13 0941  BP: 120/80  Pulse: 80  Height: 5\' 4"  (1.626 m)  Weight: 218 lb (98.884 kg)   Body mass index is 37.4 kg/(m^2).  Generalized: Well developed, in no acute distress  Head: normocephalic and atraumatic. Oropharynx benign  Neck: Supple, no carotid bruits  Cardiac: Regular rate rhythm, no murmur  Musculoskeletal: No deformity   Neurological examination  Mentation: Alert oriented to time, place, history taking. Follows all commands speech and language fluent Cranial nerve II-XII: Pupils were equal round reactive to light extraocular movements were full, visual field were full on confrontational test.  Facial sensation and strength were normal. hearing was intact to finger rubbing bilaterally. Uvula tongue midline. head turning and shoulder shrug and were normal and symmetric.Tongue protrusion into cheek strength was normal. Motor: The motor testing reveals 5 over 5 strength of all 4 extremities. Good symmetric motor tone is noted throughout.  Sensory: Sensory testing is intact to pinprick, soft touch, vibration sensation, and position sense on all 4 extremities. No evidence of extinction is noted.  Coordination: Cerebellar testing reveals good finger-nose-finger and heel-to-shin bilaterally.  Gait and station: Gait is normal. Tandem gait is normal. Romberg is negative. No drift is seen.  Reflexes: Deep tendon reflexes are symmetric and normal bilaterally. Toes are downgoing bilaterally.   ASSESSMENT AND PLAN 48 y.o. year old female  has a past medical history of Colloid cyst of brain; Hypertension; Depression; MS (multiple sclerosis); Tachycardia, and OSA on CPAP here for follow up of RRMS.  PLAN: Continue Tecfidera. Increase Adderall to 50 mg BID, 3 months of scripts given. Check CMP, CBC wDiff today. Referral to CD for management of OSA on CPAP, records request faxed to [place]. Follow up in 6 months, sooner as needed. Orders Placed This Encounter  Procedures  . CMP  . CBC with Differential  . Ambulatory referral to Sleep Studies   Meds ordered this encounter  Medications  . amphetamine-dextroamphetamine (ADDERALL) 12.5 MG tablet    Sig: Take 2 tablets (25 mg total) by mouth 2 (two) times daily.    Dispense:  120 tablet    Refill:  0        Order Specific Question:  Supervising Provider    Answer:  DOHMEIER, CARMEN [[id number]]  . amphetamine-dextroamphetamine (ADDERALL) 12.5 MG tablet    Sig: Take 2 tablets (25 mg total) by mouth 2 (two) times daily.    Dispense:  120 tablet    Refill:  0    Pharmacy, Do not fill until 01/08/14.    Order Specific Question:  Supervising  Provider    Answer:  Brett Fairy, CARMEN [[id number]]  . amphetamine-dextroamphetamine (ADDERALL) 12.5 MG tablet    Sig: Take 2 tablets (25 mg total) by mouth 2 (two) times daily.    Dispense:  120 tablet    Refill:  0    Pharmacy, Do not fill until 12/09/13.    Order Specific Question:  Supervising Provider    Answer:  Brett Fairy, CARMEN [[id number]]   Return in about 6 months (around 05/11/2014).  Philmore Pali, MSN, NP-C 11/08/2013, 10:25 AM [facility] [address], [place], [place] [address] [phone]  Note: This document was prepared with digital dictation and possible smart phrase technology. Any transcriptional errors that result from this process are unintentional.

## 2013-11-09 LAB — COMPREHENSIVE METABOLIC PANEL
ALT: 14 IU/L (ref 0–32)
AST: 16 IU/L (ref 0–40)
Albumin/Globulin Ratio: 1.5 (ref 1.1–2.5)
Albumin: 4.2 g/dL (ref 3.5–5.5)
Alkaline Phosphatase: 59 IU/L (ref 39–117)
BUN/Creatinine Ratio: 25 — ABNORMAL HIGH (ref 9–23)
BUN: 16 mg/dL (ref 6–24)
CO2: 24 mmol/L (ref 18–29)
Calcium: 9.6 mg/dL (ref 8.7–10.2)
Chloride: 102 mmol/L (ref 96–108)
Creatinine, Ser: 0.63 mg/dL (ref 0.57–1.00)
GFR calc Af Amer: 125 mL/min/{1.73_m2} (ref >59)
GFR calc non Af Amer: 109 mL/min/{1.73_m2} (ref >59)
Globulin, Total: 2.8 g/dL (ref 1.5–4.5)
Glucose: 96 mg/dL (ref 65–99)
Potassium: 4.6 mmol/L (ref 3.5–5.2)
Sodium: 137 mmol/L (ref 134–144)
Total Bilirubin: 0.2 mg/dL (ref 0.0–1.2)
Total Protein: 7 g/dL (ref 6.0–8.5)

## 2013-11-09 LAB — CBC WITH DIFFERENTIAL/PLATELET
Basophils Absolute: 0 10*3/uL (ref 0.0–0.2)
Basos: 1 %
Eos: 3 %
Eosinophils Absolute: 0.2 10*3/uL (ref 0.0–0.4)
HCT: 38.9 % (ref 34.0–46.6)
Hemoglobin: 13.3 g/dL (ref 11.1–15.9)
Lymphocytes Absolute: 1.1 10*3/uL (ref 0.7–3.1)
Lymphs: 17 %
MCH: 31.4 pg (ref 26.6–33.0)
MCHC: 34.2 g/dL (ref 31.5–35.7)
MCV: 92 fL (ref 79–97)
Monocytes Absolute: 0.5 10*3/uL (ref 0.1–0.9)
Monocytes: 8 %
Neutrophils Absolute: 4.6 10*3/uL (ref 1.4–7.0)
Neutrophils Relative %: 71 %
RBC: 4.24 x10E6/uL (ref 3.77–5.28)
RDW: 13.1 % (ref 12.3–15.4)
WBC: 6.5 10*3/uL (ref 3.4–10.8)

## 2013-11-11 NOTE — Progress Notes (Signed)
I agree with the assessment and plan as directed by NP .The patient is known to me .   Rhesa Forsberg, MD  

## 2013-11-13 NOTE — Progress Notes (Signed)
Quick Note:  Left vm of normal liver enzymes. ______

## 2013-11-15 ENCOUNTER — Other Ambulatory Visit: Payer: Self-pay | Admitting: Physician Assistant

## 2013-11-15 ENCOUNTER — Other Ambulatory Visit: Payer: Self-pay

## 2013-11-15 MED ORDER — DIMETHYL FUMARATE 240 MG PO CPDR: 240.0000 mg | DELAYED_RELEASE_CAPSULE | Freq: Two times a day (BID) | ORAL | Status: DC

## 2013-12-14 ENCOUNTER — Other Ambulatory Visit: Payer: Self-pay | Admitting: Family Medicine

## 2014-01-03 ENCOUNTER — Encounter (INDEPENDENT_AMBULATORY_CARE_PROVIDER_SITE_OTHER): Payer: Self-pay

## 2014-01-03 ENCOUNTER — Encounter: Payer: Self-pay | Admitting: Neurology

## 2014-01-03 ENCOUNTER — Ambulatory Visit (INDEPENDENT_AMBULATORY_CARE_PROVIDER_SITE_OTHER): Payer: Commercial Managed Care - PPO | Admitting: Neurology

## 2014-01-03 VITALS — BP 119/80 | HR 91 | Resp 16 | Ht 64.0 in | Wt 212.0 lb

## 2014-01-03 DIAGNOSIS — Z9989 Dependence on other enabling machines and devices: Secondary | ICD-10-CM

## 2014-01-03 DIAGNOSIS — G4733 Obstructive sleep apnea (adult) (pediatric): Secondary | ICD-10-CM

## 2014-01-03 DIAGNOSIS — G4710 Hypersomnia, unspecified: Secondary | ICD-10-CM

## 2014-01-03 DIAGNOSIS — G35 Multiple sclerosis: Secondary | ICD-10-CM

## 2014-01-03 DIAGNOSIS — G4730 Sleep apnea, unspecified: Secondary | ICD-10-CM

## 2014-01-03 MED ORDER — AMPHETAMINE-DEXTROAMPHET ER 30 MG PO CP24: 30.0000 mg | ORAL_CAPSULE | Freq: Every day | ORAL | Status: DC

## 2014-01-03 NOTE — Addendum Note (Signed)
Addended by: Larey Seat on: 01/03/2014 09:31 AM   Modules accepted: Orders

## 2014-01-03 NOTE — Progress Notes (Signed)
PATIENT: Alyssa Bass DOB: Sep 16, 1965  REASON FOR VISIT:Sleep clinic follow up HISTORY FROM: patient  HISTORY OF PRESENT ILLNESS: Alyssa Bass is a 48 y.o. With  multiple sclerosis since 1997.   01-03-14 CD I see Alyssa Bass today for a follow up on fatigue and excessive daytime sleepiness. The patient is on CPAP for OSA and was followed by a [facility] sleep clinic , Alyssa Bass saw her. The dependence got closed.  She was diagnosed based on a overall AHI of only 7.8 and REM dependent AHi 22.4 with desaturations in oxygen.  CPAP has been used at 10 cm water since , with a nasal mask. She doesn't like the straps on her head.  She would be delighted to get a new headset - and was happy to hear her download and compliance reviewed as high.  The download  encompasses 453 days since the last download !. The patient has a 96% compliance,she was daily by time is 7 hours and 46 minutes and the residual AHI is only 0.2.  This is an almost complete resolution of apnea with one of the highest compliance is. Set pressures 10 cm to 3 cm EPR. She still has a moderate air leak. I would like for her to try a nasal pillow .           Last visit with Alyssa Bass. 11/08/13 (LL): Alyssa Bass returns for followup, she has been on Tecfidera for about 4 months and loves it.  She has a short episode of flushing at 11 am daily, but that is the only side effect. She has been doing well, except still very fatigued; she takes 50 mg adderall in the morning, and 25 mg after lunch on some days, and is very tired in the afternoons and must go to bed by 9 pm each night. She wears cpap each night and previously saw a MD at [facility] in [place] for this; the office has since closed, and she wishes to to be managed by Alyssa Bass since she already comes to out office for MS.  PRIOR HPI (CD):  after delivering a healthy baby daughter. A spinal tap had revealed only one band, but she developed optic  neuritis in 2000, when she developed sensory symptoms.  A repeat CSF test revealed than in 2000 the oligoclonal bands and established the diagnosis.  Alyssa Bass, a former patient of Alyssa Bass, has been followed by him for her multiple sclerosis- but she also carries a diagnosis of an AVM and intra-ventricular cyst.  To screen for progression in both diseases she has undergone regular MRIs. She is also followed at [facility] where she was seen, from where the more recent imaging studies are available .  I have a copy here of her last MRI from 9 2012 showing that the patient has multiple foci of periventricular and deep white matter lesions but predominantly perivascular.  Her white matter disease has gradually progressed since the Baseline image and examination in 2002.  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  There was also no significant change in the left frontal venous angioma.  She has had elevated LFTs in the past, and a positive JC Virus antibody screen.  REVIEW OF SYSTEMS: Full 14 system review of systems performed and notable only for:  Flushing, Env allergies  ALLERGIES: No Known  Allergies  HOME MEDICATIONS: Outpatient Prescriptions Prior to Visit  Medication Sig Dispense Refill  . amphetamine-dextroamphetamine (ADDERALL) 12.5 MG tablet Take 2 tablets (25 mg total) by mouth 2 (two) times daily.  120 tablet  0  . Calcium Carbonate (CALTRATE 600) 1500 MG TABS Take by mouth daily.        [name] Kitchen lisinopril-hydrochlorothiazide (PRINZIDE,ZESTORETIC) 20-12.5 MG per tablet TAKE 1 TABLET DAILY  90 tablet  0  . sertraline (ZOLOFT) 50 MG tablet TAKE 1 TABLET DAILY  90 tablet  0  . traMADol (ULTRAM) 50 MG tablet TAKE 1 TABLET (50 MG TOTAL) BY MOUTH 2 (TWO) TIMES DAILY AS NEEDED FOR PAIN.  60 tablet  0  . Dimethyl Fumarate (TECFIDERA) 240 MG CPDR Take 1 capsule (240 mg total) by mouth 2 (two) times  daily.  180 capsule  1  . fluticasone (FLONASE) 50 MCG/ACT nasal spray Place 2 sprays into both nostrils daily.  16 g  1  . levocetirizine (XYZAL) 5 MG tablet TAKE 1 TABLET (5 MG TOTAL) BY MOUTH AT BEDTIME.  30 tablet  1  . Olopatadine HCl 0.2 % SOLN Apply 1 drop to eye daily.  1 Bottle  0   No facility-administered medications prior to visit.     PHYSICAL EXAM  Filed Vitals:   01/03/14 0841  BP: 119/80  Pulse: 91  Resp: 16  Height: 5\' 4"  (1.626 m)  Weight: 212 lb (96.163 kg)   Body mass index is 36.37 kg/(m^2).   Neck size 16 inches , Mallampati 3 .  Generalized: Well developed, in no acute distress  Head: normocephalic and atraumatic. Oropharynx benign  Neck: Supple, no carotid bruits  Cardiac: Regular rate rhythm, no murmur  Musculoskeletal: No deformity   Neurological examination  Mentation: Alert oriented to time, place, history taking. Follows all commands speech and language fluent Cranial nerve II-XII: Pupils were equal round reactive to light extraocular movements were full, visual field were full on confrontational test. Facial sensation and strength were normal. hearing was intact to finger rubbing bilaterally. Uvula tongue midline. head turning and shoulder shrug and were normal and symmetric. Tongue protrusion into cheek strength was normal. Motor: The motor testing reveals 5 over 5 strength of all 4 extremities.  Good symmetric motor tone is noted throughout.  Sensory: Sensory testing is intact to pinprick, soft touch, vibration sensation, and position sense on all 4 extremities. No evidence of extinction is noted.  Coordination: Cerebellar testing reveals good finger-nose-fingerbilaterally.  Gait and station: Gait is normal. Tandem gait is normal. Romberg is negative. No drift is seen.  Reflexes: Deep tendon reflexes are symmetric and normal bilaterally.   ASSESSMENT AND PLAN 48 y.o. year old female  has a past medical history of Colloid cyst of brain;  Hypertension; Depression; MS (multiple sclerosis); Tachycardia, and OSA on CPAP here for follow up of RRMS.  PLAN: Continue Tecfidera. Increase Adderall to 50 mg BID, 3 months of scripts given. Check CMP, CBC wDiff today. Referral to CD for management of OSA on CPAP, records request faxed to [place]. Follow up in 6 months, sooner as needed. Orders Placed This Encounter  Procedures  . CMP  . CBC with Differential  . Ambulatory referral to Sleep Studies    Patient on tacifidera after switching off Rebif.  On Adderall 25 mg bid po.  Switch to  Aderall XR 30 mg once a day .   No headaches . Less fatigue.    Larey Seat, MD Fellow of the ABPN< AASM<ABSM .  01/03/2014, 8:57 AM [facility] [address] [place], [place] [address] [phone]  Note: This document was prepared with digital dictation and possible smart phrase technology. Any transcriptional errors that result from this process are unintentional.

## 2014-01-03 NOTE — Patient Instructions (Signed)
Sleep Apnea  Sleep apnea is a sleep disorder characterized by abnormal pauses in breathing while you sleep. When your breathing pauses, the level of oxygen in your blood decreases. This causes you to move out of deep sleep and into light sleep. As a result, your quality of sleep is poor, and the system that carries your blood throughout your body (cardiovascular system) experiences stress. If sleep apnea remains untreated, the following conditions can develop:  High blood pressure (hypertension).  Coronary artery disease.  Inability to achieve or maintain an erection (impotence).  Impairment of your thought process (cognitive dysfunction). There are three types of sleep apnea: 1. Obstructive sleep apnea--Pauses in breathing during sleep because of a blocked airway. 2. Central sleep apnea--Pauses in breathing during sleep because the area of the brain that controls your breathing does not send the correct signals to the muscles that control breathing. 3. Mixed sleep apnea--A combination of both obstructive and central sleep apnea. RISK FACTORS The following risk factors can increase your risk of developing sleep apnea:  Being overweight.  Smoking.  Having narrow passages in your nose and throat.  Being of older age.  Being female.  Alcohol use.  Sedative and tranquilizer use.  Ethnicity. Among individuals younger than 35 years, African Americans are at increased risk of sleep apnea. SYMPTOMS   Difficulty staying asleep.  Daytime sleepiness and fatigue.  Loss of energy.  Irritability.  Loud, heavy snoring.  Morning headaches.  Trouble concentrating.  Forgetfulness.  Decreased interest in sex. DIAGNOSIS  In order to diagnose sleep apnea, your caregiver will perform a physical examination. Your caregiver may suggest that you take a home sleep test. Your caregiver may also recommend that you spend the night in a sleep lab. In the sleep lab, several monitors record  information about your heart, lungs, and brain while you sleep. Your leg and arm movements and blood oxygen level are also recorded. TREATMENT The following actions may help to resolve mild sleep apnea:  Sleeping on your side.   Using a decongestant if you have nasal congestion.   Avoiding the use of depressants, including alcohol, sedatives, and narcotics.   Losing weight and modifying your diet if you are overweight. There also are devices and treatments to help open your airway:  Oral appliances. These are custom-made mouthpieces that shift your lower jaw forward and slightly open your bite. This opens your airway.  Devices that create positive airway pressure. This positive pressure "splints" your airway open to help you breathe better during sleep. The following devices create positive airway pressure:  Continuous positive airway pressure (CPAP) device. The CPAP device creates a continuous level of air pressure with an air pump. The air is delivered to your airway through a mask while you sleep. This continuous pressure keeps your airway open.  Nasal expiratory positive airway pressure (EPAP) device. The EPAP device creates positive air pressure as you exhale. The device consists of single-use valves, which are inserted into each nostril and held in place by adhesive. The valves create very little resistance when you inhale but create much more resistance when you exhale. That increased resistance creates the positive airway pressure. This positive pressure while you exhale keeps your airway open, making it easier to breath when you inhale again.  Bilevel positive airway pressure (BPAP) device. The BPAP device is used mainly in patients with central sleep apnea. This device is similar to the CPAP device because it also uses an air pump to deliver continuous air pressure   through a mask. However, with the BPAP machine, the pressure is set at two different levels. The pressure when you  exhale is lower than the pressure when you inhale.  Surgery. Typically, surgery is only done if you cannot comply with less invasive treatments or if the less invasive treatments do not improve your condition. Surgery involves removing excess tissue in your airway to create a wider passage way. Document Released: 06/17/2002 Document Revised: 10/22/2012 Document Reviewed: 11/03/2011 ExitCare Patient Information 2015 [employer]. This information is not intended to replace advice given to you by your health care Alyssa Bass. Make sure you discuss any questions you have with your health care Alyssa Bass.  

## 2014-01-03 NOTE — Progress Notes (Signed)
Patient arrives for mask fitting.  Dr. Brett Fairy requests she be given a nasal pillow mask.  Patient complains of hair falling out because of what sounds like a "softcap" (haven't seen that in a while).  She says it is covering large portions of her head and uncomfortable.  She is fitted and sent home with a ResMEd P10 for her mask and fits in a size XS, she is also given the small pillow as a spare.  She is made aware that if she likes this mask, when it is time to replace, she can get one from [facility] without issue.  Care and cleaning is discussed and she understands well.  She knows that if she doesn't like this one after using it at home, she will call me so we can meet again to find something that will work better for her.  I told her I would make sure [facility] was aware of her new mask for their records.  This note will be routed to [facility].

## 2014-01-14 ENCOUNTER — Other Ambulatory Visit: Payer: Self-pay | Admitting: Neurology

## 2014-02-04 ENCOUNTER — Telehealth: Payer: Self-pay | Admitting: Neurology

## 2014-02-04 DIAGNOSIS — Z9989 Dependence on other enabling machines and devices: Secondary | ICD-10-CM

## 2014-02-04 DIAGNOSIS — G4733 Obstructive sleep apnea (adult) (pediatric): Secondary | ICD-10-CM

## 2014-02-04 MED ORDER — SERTRALINE HCL 100 MG PO TABS: 100.0000 mg | ORAL_TABLET | Freq: Every day | ORAL | Status: DC

## 2014-02-04 MED ORDER — AMPHETAMINE-DEXTROAMPHET ER 30 MG PO CP24: 30.0000 mg | ORAL_CAPSULE | Freq: Every day | ORAL | Status: DC

## 2014-02-04 NOTE — Telephone Encounter (Signed)
Dr Dohmeier has updated the Zoloft Rx to 100mg  daily and sent Rx to [facility].  I called the patient back.  She is aware and will call us back if anything further is needed.

## 2014-02-04 NOTE — Telephone Encounter (Signed)
Called pt to inform her that her Rx was ready to be picked up at the front desk and if she has any other problems, questions or concerns to call the office. Pt verbalized understanding. °

## 2014-02-04 NOTE — Telephone Encounter (Signed)
Patient calling to request refill of Adderall and Tramadol. Patient also wants some advise regarding her Zoloft 50 mg medication, states that she has been doubling up but it has not helped with her depression, states that her daughter is going to school and she just spends a lot of time crying. Please return call to patient and advise.

## 2014-02-04 NOTE — Telephone Encounter (Signed)
Please advise 

## 2014-02-04 NOTE — Telephone Encounter (Signed)
Patient was asking for a refill on Adderall and Tramadol.  We have not been prescribing Tramadol for her.  I called her back and she said she called our office in error regarding this Rx.  She has been taking Zoloft 50mg  daily for several years.  Dr Erling Cruz had been prescribing it since 2010 according to [facility] records.  Patient said her daughter is going to school and this has made her sad, so she cries a lot.  She has taken it upon herself to take two 50mg  tablets at times rather than one.  Says she has been doing this on and off for 2 months and does not feel this has helped and would like to know what Dr Dohmeier recommends.  Please advise.  Thank you.

## 2014-02-25 ENCOUNTER — Other Ambulatory Visit: Payer: Self-pay | Admitting: Family Medicine

## 2014-03-12 ENCOUNTER — Other Ambulatory Visit: Payer: Self-pay | Admitting: Neurology

## 2014-03-12 DIAGNOSIS — G4733 Obstructive sleep apnea (adult) (pediatric): Secondary | ICD-10-CM

## 2014-03-12 DIAGNOSIS — Z9989 Dependence on other enabling machines and devices: Secondary | ICD-10-CM

## 2014-03-12 MED ORDER — AMPHETAMINE-DEXTROAMPHET ER 30 MG PO CP24: 30.0000 mg | ORAL_CAPSULE | Freq: Every day | ORAL | Status: DC

## 2014-03-12 NOTE — Telephone Encounter (Signed)
Patient requesting Rx refill of amphetamine-dextroamphetamine (ADDERALL XR) 30 MG 24 hr capsule.  Please call anytime when ready for pickup and may leave detailed message if not available.

## 2014-03-12 NOTE — Telephone Encounter (Signed)
Rx entered and forwarded to provider for approval

## 2014-03-14 ENCOUNTER — Other Ambulatory Visit: Payer: Self-pay | Admitting: *Deleted

## 2014-03-14 ENCOUNTER — Telehealth: Payer: Self-pay | Admitting: Neurology

## 2014-03-14 DIAGNOSIS — Z9989 Dependence on other enabling machines and devices: Secondary | ICD-10-CM

## 2014-03-14 DIAGNOSIS — G4733 Obstructive sleep apnea (adult) (pediatric): Secondary | ICD-10-CM

## 2014-03-14 MED ORDER — AMPHETAMINE-DEXTROAMPHET ER 30 MG PO CP24: 30.0000 mg | ORAL_CAPSULE | Freq: Every day | ORAL | Status: DC

## 2014-03-14 NOTE — Telephone Encounter (Signed)
Patient requesting refill of Adderall script to pick up, states that she ran out on Wednesday, please call her back and advise.

## 2014-03-14 NOTE — Telephone Encounter (Signed)
Pt's Rx was reprinted and pt was called to pick up his Rx at the front desk. Pt verbalized understanding.

## 2014-03-14 NOTE — Telephone Encounter (Signed)
Patient was notified that the prescription for the Adderall is ready to be picked up.  Patient verbalized understanding.

## 2014-04-17 ENCOUNTER — Other Ambulatory Visit: Payer: Self-pay | Admitting: Neurology

## 2014-04-17 DIAGNOSIS — Z9989 Dependence on other enabling machines and devices: Secondary | ICD-10-CM

## 2014-04-17 DIAGNOSIS — G4733 Obstructive sleep apnea (adult) (pediatric): Secondary | ICD-10-CM

## 2014-04-17 MED ORDER — AMPHETAMINE-DEXTROAMPHET ER 30 MG PO CP24: 30.0000 mg | ORAL_CAPSULE | Freq: Every day | ORAL | Status: DC

## 2014-04-17 NOTE — Telephone Encounter (Signed)
Request forwarded to provider for approval  

## 2014-04-17 NOTE — Telephone Encounter (Signed)
Called pt to inform her that her Rx was ready to be picked up at the front desk and if she has any other problems, questions or concerns to call the office. Pt verbalized understanding. °

## 2014-04-17 NOTE — Telephone Encounter (Signed)
Patient requesting Rx refill for amphetamine-dextroamphetamine (ADDERALL XR) 30 MG 24 hr capsule.  Please call when ready for pick up. °

## 2014-05-12 ENCOUNTER — Encounter: Payer: Self-pay | Admitting: Nurse Practitioner

## 2014-05-12 ENCOUNTER — Ambulatory Visit (INDEPENDENT_AMBULATORY_CARE_PROVIDER_SITE_OTHER): Payer: Commercial Managed Care - PPO | Admitting: Nurse Practitioner

## 2014-05-12 ENCOUNTER — Encounter: Payer: Self-pay | Admitting: Neurology

## 2014-05-12 VITALS — BP 140/86 | HR 93 | Ht 64.0 in | Wt 198.0 lb

## 2014-05-12 DIAGNOSIS — G4714 Hypersomnia due to medical condition: Secondary | ICD-10-CM

## 2014-05-12 DIAGNOSIS — Z5181 Encounter for therapeutic drug level monitoring: Secondary | ICD-10-CM

## 2014-05-12 DIAGNOSIS — G4733 Obstructive sleep apnea (adult) (pediatric): Secondary | ICD-10-CM

## 2014-05-12 DIAGNOSIS — G35 Multiple sclerosis: Secondary | ICD-10-CM

## 2014-05-12 DIAGNOSIS — Z9989 Dependence on other enabling machines and devices: Secondary | ICD-10-CM

## 2014-05-12 MED ORDER — AMPHETAMINE-DEXTROAMPHET ER 30 MG PO CP24: 30.0000 mg | ORAL_CAPSULE | Freq: Every day | ORAL | Status: DC

## 2014-05-12 NOTE — Progress Notes (Signed)
I agree with the assessment and plan as directed by NP .The patient is known to me .   Haylee Mcanany, MD  

## 2014-05-12 NOTE — Patient Instructions (Addendum)
Continue Tecfidera twice daily.  Continue cpap at current settings, stop by the sleep lab to ask them about your mask.  Continue Adderaall at current dose.  We will check labs today, cbc, cmp and vitamin D and clal you with results.  Follow up in 6 months, sooner as needed.

## 2014-05-12 NOTE — Progress Notes (Signed)
PATIENT: Alyssa Bass DOB: 01-27-1966  REASON FOR VISIT: routine follow up for MS HISTORY FROM: patient  HISTORY OF PRESENT ILLNESS: Alyssa Bass is a 48 y.o. With multiple sclerosis since 1997.   05/12/14 (LL): Mrs. Alyssa Bass returns for 6 month revisit, she has been doing well on Tecfidera, no relapses or new symptoms since last visit.  Has flushing as her only side effect which she states is random times. She continues to feel that her memory is not what it once was and has occasional positional dizziness, but does not have it at this time. She continues to have mild numbness in her right fingers, this has been long-standing. She feels much better since switching to Adderall XR taking one a day because she often forget the second dose of the immediate release, when she remembered it was too late in the day to take it.  I reviewed the patient's CPAP compliance data from 02/11/14 to 05/11/14, which is a total of 90 days, during which time the patient used CPAP every day except for 1 day. The average usage for all days was 8 hours and 2 minutes. The percent used days greater than 4 hours was 98%, indicating excellent compliance. The residual AHI was 0.2 per hour, indicating an good treatment pressure of 10 cwp with EPR of 3. Air leak from the mask was median 0.8. She is using the nasal pillow, but has a concern with the mask strap causing hair loss behind the temples. She endorses a huge improvement in her sleep using he cpap.  01-03-14 CD  I see Mrs. Alyssa Bass today for a follow up on fatigue and excessive daytime sleepiness.  The patient is on CPAP for OSA and was followed by a [facility], Dr. Mickel Fuchs saw her. The dependence got closed.  She was diagnosed based on a overall AHI of only 7.8 and REM dependent AHi 22.4 with desaturations in oxygen.  CPAP has been used at 10 cm water since, with a nasal mask. She doesn't like the straps on her head.  She would be delighted to get a  new headset - and was happy to hear her download and compliance reviewed as high.  The download encompasses 453 days since the last download !. The patient has a 96% compliance, she was daily by time is 7 hours and 46 minutes and the residual AHI is only 0.2.  This is an almost complete resolution of apnea with one of the highest compliance is. Set pressures 10 cm to 3 cm EPR. She still has a moderate air leak.  I would like for her to try a nasal pillow .  11/08/13 (LL): Mrs. Alyssa Bass returns for followup, she has been on Tecfidera for about 4 months and loves it. She has a short episode of flushing at 11 am daily, but that is the only side effect. She has been doing well, except still very fatigued; she takes 50 mg adderall in the morning, and 25 mg after lunch on some days, and is very tired in the afternoons and must go to bed by 9 pm each night. She wears cpap each night and previously saw a MD at [facility] in [place] for this; the office has since closed, and she wishes to to be managed by Dr. Brett Fairy since she already comes to out office for MS.  PRIOR HPI (CD): after delivering a healthy baby daughter. A spinal tap had revealed only one band, but she developed  optic neuritis in 2000, when she developed sensory symptoms.  A repeat CSF test revealed than in 2000 the oligoclonal bands and established the diagnosis.  Mrs. Alyssa Bass, a former patient of Dr. Morene Antu, has been followed by him for her multiple sclerosis- but she also carries a diagnosis of an AVM and intra-ventricular cyst.  To screen for progression in both diseases she has undergone regular MRIs. She is also followed at [facility] where she was seen, from where the more recent imaging studies are available .  I have a copy here of her last MRI from 9 2012 showing that the patient has multiple foci of periventricular and deep white matter lesions but predominantly perivascular.  Her white matter disease has gradually progressed since the  Baseline image and examination in 2002.  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 There was also no significant change in the left frontal venous angioma.  She has had elevated LFTs in the past, and a positive JC Virus antibody screen.   REVIEW OF SYSTEMS: Full 14 system review of systems performed and notable only for:  Flushing, memory loss, dizziness, numbness  ALLERGIES: No Known Allergies  HOME MEDICATIONS: Outpatient Prescriptions Prior to Visit  Medication Sig Dispense Refill  . Calcium Carbonate (CALTRATE 600) 1500 MG TABS Take by mouth daily.      . Dimethyl Fumarate 240 MG CPDR Take 240 mg by mouth 2 (two) times daily.     . fluticasone (FLONASE) 50 MCG/ACT nasal spray Place 2 sprays into both nostrils daily. As needed    . lisinopril-hydrochlorothiazide (PRINZIDE,ZESTORETIC) 20-12.5 MG per tablet TAKE 1 TABLET DAILY 30 tablet 0  . sertraline (ZOLOFT) 100 MG tablet Take 1 tablet (100 mg total) by mouth daily. 90 tablet 3  . amphetamine-dextroamphetamine (ADDERALL XR) 30 MG 24 hr capsule Take 1 capsule (30 mg total) by mouth daily. 30 capsule 0  . levocetirizine (XYZAL) 5 MG tablet TAKE 1 TABLET (5 MG TOTAL) BY MOUTH AT BEDTIME as needed    . Olopatadine HCl 0.2 % SOLN Apply 1 drop to eye daily. As needed    . traMADol (ULTRAM) 50 MG tablet TAKE 1 TABLET (50 MG TOTAL) BY MOUTH 2 (TWO) TIMES DAILY AS NEEDED FOR PAIN. 60 tablet 0   No facility-administered medications prior to visit.    PHYSICAL EXAM Filed Vitals:   05/12/14 0837  BP: 140/86  Pulse: 93  Height: 5\' 4"  (1.626 m)  Weight: 198 lb (89.812 kg)   Body mass index is 33.97 kg/(m^2).  Neck size 16 inches , Mallampati 3 .   Generalized: Well developed, in no acute distress   Head: normocephalic and atraumatic. Oropharynx benign   Neck: Supple, no carotid bruits   Cardiac: Regular rate rhythm, no murmur     Musculoskeletal: No deformity   Neurological examination   Mentation: Alert oriented to time, place, history taking. Follows all commands speech and language fluent Cranial nerve II-XII: Pupils were equal round reactive to light extraocular movements were full, visual field were full on confrontational test. Facial sensation and strength were normal. hearing was intact to finger rubbing bilaterally. Uvula tongue midline. head turning and shoulder shrug and were normal and symmetric. Tongue protrusion into cheek strength was normal. Motor: The motor testing reveals 5 over 5 strength of all 4 extremities.  Good symmetric motor tone is noted throughout.  Sensory: Sensory testing is intact to pinprick, soft touch, vibration sensation, and position sense on all 4 extremities. Mild sensory deficit in her right fingertips. No evidence of extinction is noted.   Coordination: Cerebellar testing reveals good finger-nose-fingerbilaterally.   Gait and station: Gait is normal. Tandem gait is normal. Romberg is negative. No drift is seen.  Reflexes: Deep tendon reflexes are symmetric and normal bilaterally.   DIAGNOSTIC DATA (LABS, IMAGING, TESTING) - I reviewed patient records, labs, notes, testing and imaging myself where available.  Lab Results  Component Value Date   WBC 6.5 11/08/2013   HGB 13.3 11/08/2013   HCT 38.9 11/08/2013   MCV 92 11/08/2013   PLT 361 04/17/2013      Component Value Date/Time   NA 137 11/08/2013 1023   NA 137 01/01/2013 0825   K 4.6 11/08/2013 1023   CL 102 11/08/2013 1023   CO2 24 11/08/2013 1023   GLUCOSE 96 11/08/2013 1023   GLUCOSE 109* 01/01/2013 0825   BUN 16 11/08/2013 1023   BUN 14 01/01/2013 0825   CREATININE 0.63 11/08/2013 1023   CREATININE 0.76 01/01/2013 0825   CALCIUM 9.6 11/08/2013 1023   PROT 7.0 11/08/2013 1023   PROT 7.3 01/01/2013 0825   ALBUMIN 3.9 01/01/2013 0825   AST 16 11/08/2013 1023   ALT 14 11/08/2013 1023   ALKPHOS 59 11/08/2013  1023   BILITOT 0.2 11/08/2013 1023   GFRNONAA 109 11/08/2013 1023   GFRNONAA >89 11/21/2012 0900   GFRAA 125 11/08/2013 1023   GFRAA >89 11/21/2012 0900    ASSESSMENT: 48 year old female  has a past medical history of Colloid cyst of brain; Hypertension; Depression; MS (multiple sclerosis); Tachycardia, and OSA on CPAP here for follow up of RRMS. Patient on Tecifidera after switching off Rebif.   No headaches. Less fatigue. Excessive daytime sleepiness is well controlled on Adderall XR.  PLAN: Continue Tecfidera 240 mg BID. Continue Aderall XR 30 mg once a day.   Check CMP, CBC wDiff, and Vitamin D today. Advised her to stop in the sleep lab for questions she has about her mask strap and hair loss. Follow up in 6 months, with Dr. Brett Fairy, sooner as needed.  Orders Placed This Encounter  Procedures  . CBC With differential/Platelet  . Comprehensive metabolic panel  . Vitamin D, 25-hydroxy   Meds ordered this encounter  Medications  . amphetamine-dextroamphetamine (ADDERALL XR) 30 MG 24 hr capsule    Sig: Take 1 capsule (30 mg total) by mouth daily.    Dispense:  30 capsule    Refill:  0    Please call patient for pick up    Order Specific Question:  Supervising Provider    Answer:  Brett Fairy, CARMEN [[id number]]   Rudi Rummage LAM, MSN, FNP-BC, A/GNP-C 05/12/2014, 9:23 AM [facility] [address] [place],  [address] [phone]  Note: This document was prepared with digital dictation and possible smart phrase technology. Any transcriptional errors that result from this process are unintentional.

## 2014-05-13 LAB — COMPREHENSIVE METABOLIC PANEL
ALT: 12 IU/L (ref 0–32)
AST: 15 IU/L (ref 0–40)
Albumin/Globulin Ratio: 1.7 (ref 1.1–2.5)
Albumin: 4.3 g/dL (ref 3.5–5.5)
Alkaline Phosphatase: 54 IU/L (ref 39–117)
BUN/Creatinine Ratio: 22 (ref 9–23)
BUN: 15 mg/dL (ref 6–24)
CO2: 25 mmol/L (ref 18–29)
Calcium: 9.2 mg/dL (ref 8.7–10.2)
Chloride: 97 mmol/L (ref 97–108)
Creatinine, Ser: 0.69 mg/dL (ref 0.57–1.00)
GFR calc Af Amer: 121 mL/min/{1.73_m2} (ref >59)
GFR calc non Af Amer: 105 mL/min/{1.73_m2} (ref >59)
Globulin, Total: 2.5 g/dL (ref 1.5–4.5)
Glucose: 98 mg/dL (ref 65–99)
Potassium: 4.3 mmol/L (ref 3.5–5.2)
Sodium: 138 mmol/L (ref 134–144)
Total Bilirubin: 0.2 mg/dL (ref 0.0–1.2)
Total Protein: 6.8 g/dL (ref 6.0–8.5)

## 2014-05-13 LAB — VITAMIN D 25 HYDROXY (VIT D DEFICIENCY, FRACTURES): Vit D, 25-Hydroxy: 33.3 ng/mL (ref 30.0–100.0)

## 2014-05-13 LAB — CBC WITH DIFFERENTIAL
Basophils Absolute: 0 10*3/uL (ref 0.0–0.2)
Basos: 0 %
Eos: 2 %
Eosinophils Absolute: 0.2 10*3/uL (ref 0.0–0.4)
HCT: 39.1 % (ref 34.0–46.6)
Hemoglobin: 13.2 g/dL (ref 11.1–15.9)
Immature Grans (Abs): 0 10*3/uL (ref 0.0–0.1)
Immature Granulocytes: 0 %
Lymphocytes Absolute: 1.4 10*3/uL (ref 0.7–3.1)
Lymphs: 15 %
MCH: 31.9 pg (ref 26.6–33.0)
MCHC: 33.8 g/dL (ref 31.5–35.7)
MCV: 94 fL (ref 79–97)
Monocytes Absolute: 0.9 10*3/uL (ref 0.1–0.9)
Monocytes: 9 %
Neutrophils Absolute: 7 10*3/uL (ref 1.4–7.0)
Neutrophils Relative %: 74 %
Platelets: 329 10*3/uL (ref 150–379)
RBC: 4.14 x10E6/uL (ref 3.77–5.28)
RDW: 13.6 % (ref 12.3–15.4)
WBC: 9.5 10*3/uL (ref 3.4–10.8)

## 2014-05-16 ENCOUNTER — Telehealth: Payer: Self-pay | Admitting: *Deleted

## 2014-05-16 NOTE — Telephone Encounter (Signed)
Called patient and informed her of normal labs, patient verbalized understanding and had no further questions or concerns.

## 2014-05-16 NOTE — Telephone Encounter (Signed)
-----   Message from Philmore Pali, NP sent at 05/13/2014  4:24 PM EST ----- All labs are normal, please call patient and send her a copy is she likes.

## 2014-05-21 ENCOUNTER — Other Ambulatory Visit: Payer: Self-pay | Admitting: Neurology

## 2014-06-27 ENCOUNTER — Other Ambulatory Visit: Payer: Self-pay | Admitting: Neurology

## 2014-06-27 MED ORDER — AMPHETAMINE-DEXTROAMPHET ER 30 MG PO CP24: 30.0000 mg | ORAL_CAPSULE | Freq: Every day | ORAL | Status: DC

## 2014-06-27 NOTE — Telephone Encounter (Signed)
Dr Brett Fairy is out of the office.  Request entered, forwarded to [facility] for approval.  Thank you. Patient was last seen 11/02, Rx last written 11/02 for 30 day supply.

## 2014-06-27 NOTE — Telephone Encounter (Signed)
Patient is calling to get a written Rx for Adderral. Patient is out of medication. Please call when ready for pick up.  Thank you.

## 2014-06-30 NOTE — Telephone Encounter (Signed)
I called the patient to let them know their Rx for Adderall XR was ready for pickup. Patient was instructed to bring Photo ID.

## 2014-07-29 ENCOUNTER — Telehealth: Payer: Self-pay | Admitting: *Deleted

## 2014-07-29 MED ORDER — LISINOPRIL-HYDROCHLOROTHIAZIDE 20-12.5 MG PO TABS: 1.0000 | ORAL_TABLET | Freq: Every day | ORAL | Status: DC

## 2014-07-29 NOTE — Telephone Encounter (Signed)
Pt informed that she will need an appt. appt made for 2.3.2016 @ 815 for bp f/u. 30 day supply called into local pharm. She has had recent cmp done w/neurologist.Nixon Kolton, [name]

## 2014-08-11 ENCOUNTER — Other Ambulatory Visit: Payer: Self-pay | Admitting: *Deleted

## 2014-08-11 MED ORDER — AMPHETAMINE-DEXTROAMPHET ER 30 MG PO CP24: 30.0000 mg | ORAL_CAPSULE | Freq: Every day | ORAL | Status: DC

## 2014-08-11 NOTE — Telephone Encounter (Signed)
Request entered, forwarded to provider for approval.  

## 2014-08-11 NOTE — Telephone Encounter (Signed)
Patient calling wanting a written Rx for Adderall XR

## 2014-08-12 ENCOUNTER — Telehealth: Payer: Self-pay

## 2014-08-12 NOTE — Telephone Encounter (Signed)
Called patient and informed Rx ready for pick up at front desk. Patient verbalized understanding.  

## 2014-08-13 ENCOUNTER — Encounter: Payer: Self-pay | Admitting: Family Medicine

## 2014-08-13 ENCOUNTER — Ambulatory Visit (INDEPENDENT_AMBULATORY_CARE_PROVIDER_SITE_OTHER): Payer: Commercial Managed Care - PPO | Admitting: Family Medicine

## 2014-08-13 ENCOUNTER — Telehealth: Payer: Self-pay | Admitting: Family Medicine

## 2014-08-13 VITALS — BP 145/82 | HR 103 | Ht 64.0 in | Wt 192.0 lb

## 2014-08-13 DIAGNOSIS — R000 Tachycardia, unspecified: Secondary | ICD-10-CM

## 2014-08-13 DIAGNOSIS — Z9989 Dependence on other enabling machines and devices: Secondary | ICD-10-CM

## 2014-08-13 DIAGNOSIS — F430 Acute stress reaction: Secondary | ICD-10-CM

## 2014-08-13 DIAGNOSIS — I10 Essential (primary) hypertension: Secondary | ICD-10-CM

## 2014-08-13 DIAGNOSIS — G4733 Obstructive sleep apnea (adult) (pediatric): Secondary | ICD-10-CM

## 2014-08-13 MED ORDER — SERTRALINE HCL 100 MG PO TABS: 150.0000 mg | ORAL_TABLET | Freq: Every day | ORAL | Status: DC

## 2014-08-13 MED ORDER — LISINOPRIL-HYDROCHLOROTHIAZIDE 20-12.5 MG PO TABS: 1.0000 | ORAL_TABLET | Freq: Two times a day (BID) | ORAL | Status: DC

## 2014-08-13 NOTE — Telephone Encounter (Signed)
Please call patient and let her know that I did review her notes from the emergency department at [facility] from November 25. Her calcium was mildly elevated at that time. I definitely want to recheck this and make sure that it's back down into the normal range. Also, the CT of her chest did show some emphysematous changes at the apices of the lungs. Although more reason to encourage her to quit smoking again.[name] Kitchen

## 2014-08-13 NOTE — Progress Notes (Signed)
Subjective:    Patient ID: Alyssa Bass, female    DOB: Sep 19, 1965, 49 y.o.   MRN: [id number]  HPI Hypertension- Pt denies chest pain, SOB, dizziness, or heart palpitations.  Taking meds as directed w/o problems.  Denies medication side effects.    Acute stress-she is currently separating from her husband. Mrs. been extremely stressful for her. In fact she started smoking again recently after she actually quit for 12 years. She said she quit working out at the gym about 2 months ago and says she really has not been taking care of herself. She did start seeing a counselor at her church but thinks she might benefit from a regular therapist/counselor. She feels like her sertraline is not helping her right now. She just feels incredibly anxious.  Tachycardia-she recently went to the emergency department at [facility] for palpitations and chest pain. At the time her fit bit and recorded that her heart rate resting was 140. She was very concerned. They did a thorough evaluation. She did have an elevated d-dimer and had a CT scan that was negative for pulmonary embolism. She has noted that her heart rate has been up intermittently since then. She has not had any chest pain since then. They felt like it was mostly related to reflux. She's lost 6 pounds that she has not been eating well.  Review of Systems   BP 145/82 mmHg  Pulse 103  Ht 5\' 4"  (1.626 m)  Wt 192 lb (87.091 kg)  BMI 32.94 kg/m2    No Known Allergies  Past Medical History  Diagnosis Date  . 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 Hypertension   . Depression   . MS (multiple sclerosis)   . Tachycardia     Past Surgical History  Procedure Laterality Date  . Achilles tendon surgery      Hagland deformity  . Left ankle surgery  2004  . Colonoscopy w/ polypectomy      History   Social History  . Marital Status: Married    Spouse Name: Elta Guadeloupe    Number of Children: 2  . Years of Education: 14   Occupational History  .       Statistician   Social History Main Topics  . Smoking status: Current Every Day Smoker -- 1.00 packs/day    Types: Cigarettes  . Smokeless tobacco: Never Used  . Alcohol Use: 0.0 oz/week    0 Not specified per week     Comment: occas.  . Drug Use: No  . Sexual Activity: Not on file   Other Topics Concern  . 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   Patient has two children.   Patient is working full-time.   Patient has a college degree.   Patient is right-handed.   Patient drinks one cup of coffee daily.          Family History  Problem Relation Age of Onset  . Cancer      colon  . Heart attack      grandmother  . Hypertension Brother   . Hypertension Mother   . Sleep apnea Mother   . Sleep apnea Brother   . Sleep apnea Sister   . Cancer Father     pre cancerous colon    Outpatient Encounter Prescriptions as of 08/13/2014  Medication Sig  . amphetamine-dextroamphetamine (ADDERALL  XR) 30 MG 24 hr capsule Take 1 capsule (30 mg total) by mouth daily.  . Calcium Carbonate (CALTRATE 600) 1500 MG TABS Take by mouth daily.    . fluticasone (FLONASE) 50 MCG/ACT nasal spray Place 2 sprays into both nostrils daily. As needed  . levocetirizine (XYZAL) 5 MG tablet TAKE 1 TABLET (5 MG TOTAL) BY MOUTH AT BEDTIME as needed  . lisinopril-hydrochlorothiazide (PRINZIDE,ZESTORETIC) 20-12.5 MG per tablet Take 1 tablet by mouth daily.  . Olopatadine HCl 0.2 % SOLN Apply 1 drop to eye daily. As needed  . sertraline (ZOLOFT) 100 MG tablet Take 1.5 tablets (150 mg total) by mouth daily.  . TECFIDERA 240 MG CPDR TAKE 1 CAPSULE (240 MG) BY MOUTH TWICE A DAY  . [DISCONTINUED] sertraline (ZOLOFT) 100 MG tablet Take 1 tablet (100 mg total) by mouth daily.  . [DISCONTINUED] Dimethyl Fumarate 240 MG CPDR Take 240 mg by mouth 2 (two) times daily.           Objective:   Physical Exam  Constitutional: She is  oriented to person, place, and time. She appears well-developed and well-nourished.  HENT:  Head: Normocephalic and atraumatic.  Cardiovascular: Normal rate, regular rhythm and normal heart sounds.   Pulmonary/Chest: Effort normal and breath sounds normal.  Neurological: She is alert and oriented to person, place, and time.  Skin: Skin is warm and dry.  Psychiatric: She has a normal mood and affect. Her behavior is normal.          Assessment & Plan:  Acute stress - we discussed options. I would like to refer her to a local therapist. Also increase her sertraline 250 mg. New prescription sent. Follow-up in 6-8 weeks to see how she's doing.  HTN- uncontrolled. Even repeat blood pressure was still elevated. We'll increase her with single HCTZ to twice a day. New prescription sent. Follow-up in 6 weeks to recheck pressure.  Tachycardia-she is on a stimulant medication per her neurologist for her MS. We discussed that the stimulator itself can also raise her pulse. Her normal pulse when she comes here usually between 80 and 90. First, recommend avoiding all additional stimulants and that would include caffeine fromtea,  Soda and coffee. And then continue to monitor heart rate over the next week. If her resting pulse is staying over 100 then she may need to contact her neurologist about decreasing her Adderall dose.

## 2014-08-21 NOTE — Telephone Encounter (Signed)
Could be from extra calcium in the diet or because she was sick at the time.

## 2014-08-21 NOTE — Telephone Encounter (Signed)
Pt informed and wanted to know what it means that her calcium is elevated. Fwd to pcp for advice.Alyssa Bass [place]

## 2014-08-22 NOTE — Telephone Encounter (Signed)
Pt informed.Alyssa Bass  

## 2014-09-17 ENCOUNTER — Other Ambulatory Visit: Payer: Self-pay

## 2014-09-17 MED ORDER — LEVOCETIRIZINE DIHYDROCHLORIDE 5 MG PO TABS: 5.0000 mg | ORAL_TABLET | Freq: Every evening | ORAL | Status: DC

## 2014-09-21 ENCOUNTER — Other Ambulatory Visit: Payer: Self-pay | Admitting: Family Medicine

## 2014-09-24 ENCOUNTER — Ambulatory Visit: Payer: Commercial Managed Care - PPO | Admitting: Family Medicine

## 2014-10-07 ENCOUNTER — Other Ambulatory Visit: Payer: Self-pay | Admitting: Family Medicine

## 2014-10-08 ENCOUNTER — Encounter: Payer: Self-pay | Admitting: Family Medicine

## 2014-10-08 ENCOUNTER — Ambulatory Visit (INDEPENDENT_AMBULATORY_CARE_PROVIDER_SITE_OTHER): Payer: Commercial Managed Care - PPO | Admitting: Family Medicine

## 2014-10-08 VITALS — BP 123/70 | HR 120 | Ht 64.0 in | Wt 193.0 lb

## 2014-10-08 DIAGNOSIS — Z720 Tobacco use: Secondary | ICD-10-CM

## 2014-10-08 DIAGNOSIS — F430 Acute stress reaction: Secondary | ICD-10-CM

## 2014-10-08 DIAGNOSIS — I10 Essential (primary) hypertension: Secondary | ICD-10-CM

## 2014-10-08 MED ORDER — FLUTICASONE PROPIONATE 50 MCG/ACT NA SUSP: 2.0000 | Freq: Every day | NASAL | Status: DC

## 2014-10-08 MED ORDER — OLOPATADINE HCL 0.2 % OP SOLN: 1.0000 [drp] | Freq: Every day | OPHTHALMIC | Status: DC

## 2014-10-08 MED ORDER — VARENICLINE TARTRATE 0.5 MG X 11 & 1 MG X 42 PO MISC: ORAL | Status: DC

## 2014-10-08 NOTE — Progress Notes (Signed)
   Subjective:    Patient ID: Alyssa Bass, female    DOB: 01/20/1966, 49 y.o.   MRN: [id number]  HPI HTN - Inc lisinopril/HCTZ to BID at last OV.  She is occasionally missing the second dose but most days of getting it in. She denies any new side effects or concerns with the increase in medication. No chest pain or shortness of breath.  Acute stress, 8 week follow-up - She is now on zoloft 150mg  daily.  Not crying as much. Sleeping well.  Tolerating the medication well.  No regular exercise.  She did receive a phone call about counseling but she says she never called back to schedule. She said she's just been very busy.  Tob abuse - she has tried to quit smoking and she is interested in Chantix.  She has never taken before. Review of Systems     Objective:   Physical Exam  Constitutional: She is oriented to person, place, and time. She appears well-developed and well-nourished.  HENT:  Head: Normocephalic and atraumatic.  Cardiovascular: Normal rate, regular rhythm and normal heart sounds.   Pulmonary/Chest: Effort normal and breath sounds normal.  Neurological: She is alert and oriented to person, place, and time.  Skin: Skin is warm and dry.  Psychiatric: She has a normal mood and affect. Her behavior is normal.          Assessment & Plan:  HTN - well controlled. Continue current regimen. Follow-up in 6 months.  Acute stress - PHQ 9 score of 7 today she rates her symptoms as not difficult. She overall does feel better. She says she's not sure if it's really the medication or just time. Follow-up in 3 months.  tob abuse - encouraged cessation. Discussed Chantix and how it works. Discussed potential side effects. She would like to try it. Encouraged her to complete the full 3 months. Coupon card provided. Follow-up in 3 months.

## 2014-11-03 ENCOUNTER — Other Ambulatory Visit: Payer: Self-pay | Admitting: Family Medicine

## 2014-11-05 MED ORDER — VARENICLINE TARTRATE 1 MG PO TABS: 1.0000 mg | ORAL_TABLET | Freq: Two times a day (BID) | ORAL | Status: DC

## 2014-11-10 ENCOUNTER — Ambulatory Visit: Payer: Commercial Managed Care - PPO | Admitting: Neurology

## 2014-12-25 ENCOUNTER — Other Ambulatory Visit: Payer: Self-pay | Admitting: Neurology

## 2015-01-05 ENCOUNTER — Other Ambulatory Visit: Payer: Self-pay | Admitting: Family Medicine

## 2015-01-05 ENCOUNTER — Telehealth: Payer: Self-pay | Admitting: *Deleted

## 2015-01-05 NOTE — Telephone Encounter (Signed)
Called pt and advised her that she will need to have labs done. She has an appt on 01/19/15 @815  she can get them done at that time.Alyssa Bass

## 2015-01-08 ENCOUNTER — Ambulatory Visit: Payer: Commercial Managed Care - PPO | Admitting: Family Medicine

## 2015-01-19 ENCOUNTER — Encounter: Payer: Self-pay | Admitting: Family Medicine

## 2015-01-19 ENCOUNTER — Ambulatory Visit (INDEPENDENT_AMBULATORY_CARE_PROVIDER_SITE_OTHER): Payer: Commercial Managed Care - PPO | Admitting: Family Medicine

## 2015-01-19 VITALS — BP 118/82 | HR 72 | Ht 64.0 in | Wt 199.0 lb

## 2015-01-19 DIAGNOSIS — Z720 Tobacco use: Secondary | ICD-10-CM | POA: Diagnosis not present

## 2015-01-19 DIAGNOSIS — F430 Acute stress reaction: Secondary | ICD-10-CM

## 2015-01-19 DIAGNOSIS — I10 Essential (primary) hypertension: Secondary | ICD-10-CM | POA: Diagnosis not present

## 2015-01-19 DIAGNOSIS — Z114 Encounter for screening for human immunodeficiency virus [HIV]: Secondary | ICD-10-CM | POA: Diagnosis not present

## 2015-01-19 LAB — BASIC METABOLIC PANEL
BUN: 10 mg/dL (ref 6–23)
CALCIUM: 9.3 mg/dL (ref 8.4–10.5)
CO2: 24 mEq/L (ref 19–32)
Chloride: 102 mEq/L (ref 96–112)
Creat: 0.58 mg/dL (ref 0.50–1.10)
Glucose, Bld: 99 mg/dL (ref 70–99)
POTASSIUM: 4.2 meq/L (ref 3.5–5.3)
Sodium: 139 mEq/L (ref 135–145)

## 2015-01-19 LAB — LIPID PANEL
CHOL/HDL RATIO: 2.8 ratio
Cholesterol: 177 mg/dL (ref 0–200)
HDL: 64 mg/dL (ref >=46)
LDL Cholesterol: 94 mg/dL (ref 0–99)
TRIGLYCERIDES: 93 mg/dL (ref <150)
VLDL: 19 mg/dL (ref 0–40)

## 2015-01-19 MED ORDER — VARENICLINE TARTRATE 1 MG PO TABS: 1.0000 mg | ORAL_TABLET | Freq: Two times a day (BID) | ORAL | Status: DC

## 2015-01-19 NOTE — Progress Notes (Addendum)
   Subjective:    Patient ID: Alyssa Bass, female    DOB: Mar 07, 1966, 49 y.o.   MRN: [id number]  HPI  3 month f/u for acute stress - last PHQ-9 score of 7 .  Doing well on the zoloft.  She has been taking 200mg .  She has been on this med for years but we reecently increased her dose.   Tobacco abuse- she had decided to try Chantix at last OV.  She started it and then stopped it.  Says plans to restart it soon ans she feels more motivated now.    Review of Systems     Objective:   Physical Exam  Constitutional: She is oriented to person, place, and time. She appears well-developed and well-nourished.  HENT:  Head: Normocephalic and atraumatic.  Cardiovascular: Normal rate, regular rhythm and normal heart sounds.   Pulmonary/Chest: Effort normal and breath sounds normal.  Neurological: She is alert and oriented to person, place, and time.  Skin: Skin is warm and dry.  Psychiatric: She has a normal mood and affect. Her behavior is normal.          Assessment & Plan:  Acute stress- Doing well on regimen. F/U in 6 months.  Work on decreasing dose at that point.  PHQ- 9 score of 3.   tobacco abuse - Will restart the Chantix.  She wants the continuing pack.

## 2015-01-20 LAB — HIV ANTIBODY (ROUTINE TESTING W REFLEX): HIV 1&2 Ab, 4th Generation: NONREACTIVE

## 2015-02-19 ENCOUNTER — Other Ambulatory Visit: Payer: Self-pay | Admitting: Neurology

## 2015-03-13 ENCOUNTER — Other Ambulatory Visit: Payer: Self-pay | Admitting: Neurology

## 2015-04-16 ENCOUNTER — Other Ambulatory Visit: Payer: Self-pay | Admitting: Neurology

## 2015-04-21 ENCOUNTER — Encounter (HOSPITAL_COMMUNITY): Payer: Self-pay | Admitting: *Deleted

## 2015-04-30 ENCOUNTER — Other Ambulatory Visit: Payer: Self-pay | Admitting: Gastroenterology

## 2015-04-30 NOTE — Anesthesia Preprocedure Evaluation (Addendum)
Anesthesia Evaluation  Patient identified by MRN, date of birth, ID band Patient awake    Reviewed: Allergy & Precautions, H&P , NPO status , Patient's Chart, lab work & pertinent test results  Airway Mallampati: III  TM Distance: >3 FB Neck ROM: full    Dental no notable dental hx. (+) Dental Advisory Given, Teeth Intact   Pulmonary sleep apnea and Continuous Positive Airway Pressure Ventilation , Current Smoker,    Pulmonary exam normal breath sounds clear to auscultation       Cardiovascular Exercise Tolerance: Good hypertension, Pt. on medications Normal cardiovascular exam Rhythm:regular Rate:Normal     Neuro/Psych Brain cyst.  MS negative neurological ROS  negative psych ROS   GI/Hepatic negative GI ROS, Neg liver ROS,   Endo/Other  negative endocrine ROSInsulin resistance syndrome  Renal/GU negative Renal ROS  negative genitourinary   Musculoskeletal   Abdominal   Peds  Hematology negative hematology ROS (+) Blood dyscrasia, ,   Anesthesia Other Findings   Reproductive/Obstetrics negative OB ROS                            Anesthesia Physical Anesthesia Plan  ASA: III  Anesthesia Plan: MAC   Post-op Pain Management:    Induction:   Airway Management Planned:   Additional Equipment:   Intra-op Plan:   Post-operative Plan:   Informed Consent: I have reviewed the patients History and Physical, chart, labs and discussed the procedure including the risks, benefits and alternatives for the proposed anesthesia with the patient or authorized representative who has indicated his/her understanding and acceptance.   Dental Advisory Given  Plan Discussed with: CRNA and Surgeon  Anesthesia Plan Comments:         Anesthesia Quick Evaluation

## 2015-05-01 ENCOUNTER — Ambulatory Visit (HOSPITAL_COMMUNITY): Payer: Commercial Managed Care - PPO | Admitting: Anesthesiology

## 2015-05-01 ENCOUNTER — Ambulatory Visit (HOSPITAL_COMMUNITY)
Admission: RE | Admit: 2015-05-01 | Discharge: 2015-05-01 | Disposition: A | Discharge status: Home / Self Care | Payer: Commercial Managed Care - PPO | Source: Ambulatory Visit | Attending: Gastroenterology | Admitting: Gastroenterology

## 2015-05-01 ENCOUNTER — Encounter (HOSPITAL_COMMUNITY): Payer: Self-pay | Admitting: *Deleted

## 2015-05-01 ENCOUNTER — Encounter (HOSPITAL_COMMUNITY)
Admission: RE | Disposition: A | Discharge status: Home / Self Care | Payer: Self-pay | Source: Ambulatory Visit | Attending: Gastroenterology

## 2015-05-01 DIAGNOSIS — K649 Unspecified hemorrhoids: Secondary | ICD-10-CM | POA: Diagnosis not present

## 2015-05-01 DIAGNOSIS — Z800 Family history of malignant neoplasm of digestive organs: Secondary | ICD-10-CM | POA: Diagnosis not present

## 2015-05-01 DIAGNOSIS — Z6834 Body mass index (BMI) 34.0-34.9, adult: Secondary | ICD-10-CM | POA: Insufficient documentation

## 2015-05-01 DIAGNOSIS — G4710 Hypersomnia, unspecified: Secondary | ICD-10-CM | POA: Diagnosis not present

## 2015-05-01 DIAGNOSIS — R000 Tachycardia, unspecified: Secondary | ICD-10-CM | POA: Diagnosis not present

## 2015-05-01 DIAGNOSIS — D128 Benign neoplasm of rectum: Secondary | ICD-10-CM | POA: Insufficient documentation

## 2015-05-01 DIAGNOSIS — I10 Essential (primary) hypertension: Secondary | ICD-10-CM | POA: Diagnosis not present

## 2015-05-01 DIAGNOSIS — G43909 Migraine, unspecified, not intractable, without status migrainosus: Secondary | ICD-10-CM | POA: Insufficient documentation

## 2015-05-01 DIAGNOSIS — G4733 Obstructive sleep apnea (adult) (pediatric): Secondary | ICD-10-CM | POA: Diagnosis not present

## 2015-05-01 DIAGNOSIS — Z09 Encounter for follow-up examination after completed treatment for conditions other than malignant neoplasm: Secondary | ICD-10-CM | POA: Diagnosis present

## 2015-05-01 DIAGNOSIS — E669 Obesity, unspecified: Secondary | ICD-10-CM | POA: Diagnosis not present

## 2015-05-01 DIAGNOSIS — F17210 Nicotine dependence, cigarettes, uncomplicated: Secondary | ICD-10-CM | POA: Diagnosis not present

## 2015-05-01 DIAGNOSIS — G35 Multiple sclerosis: Secondary | ICD-10-CM | POA: Insufficient documentation

## 2015-05-01 HISTORY — PX: COLONOSCOPY WITH PROPOFOL: SHX5780

## 2015-05-01 SURGERY — COLONOSCOPY WITH PROPOFOL
Anesthesia: Monitor Anesthesia Care

## 2015-05-01 MED ORDER — PROPOFOL 10 MG/ML IV BOLUS
INTRAVENOUS | Status: AC
  Filled 2015-05-01: qty 20

## 2015-05-01 MED ORDER — LIDOCAINE HCL (CARDIAC) 20 MG/ML IV SOLN
INTRAVENOUS | Status: AC
  Filled 2015-05-01: qty 5

## 2015-05-01 MED ORDER — LACTATED RINGERS IV SOLN
INTRAVENOUS | Status: DC
  Administered 2015-05-01: 1000 mL via INTRAVENOUS

## 2015-05-01 MED ORDER — PROPOFOL 10 MG/ML IV BOLUS
INTRAVENOUS | Status: DC | PRN
  Administered 2015-05-01 (×2): 30 mg via INTRAVENOUS
  Administered 2015-05-01: 20 mg via INTRAVENOUS

## 2015-05-01 MED ORDER — LACTATED RINGERS IV SOLN
INTRAVENOUS | Status: DC
Start: 2015-05-01 — End: 2015-05-01

## 2015-05-01 MED ORDER — PROPOFOL 500 MG/50ML IV EMUL
INTRAVENOUS | Status: DC | PRN
  Administered 2015-05-01: 150 ug/kg/min via INTRAVENOUS

## 2015-05-01 MED ORDER — LIDOCAINE HCL (CARDIAC) 20 MG/ML IV SOLN
INTRAVENOUS | Status: DC | PRN
  Administered 2015-05-01: 100 mg via INTRAVENOUS

## 2015-05-01 MED ORDER — SODIUM CHLORIDE 0.9 % IV SOLN: INTRAVENOUS | Status: DC

## 2015-05-01 MED ORDER — ONDANSETRON HCL 4 MG/2ML IJ SOLN
INTRAMUSCULAR | Status: AC
  Filled 2015-05-01: qty 2

## 2015-05-01 MED ORDER — ONDANSETRON HCL 4 MG/2ML IJ SOLN
INTRAMUSCULAR | Status: DC | PRN
  Administered 2015-05-01: 4 mg via INTRAVENOUS

## 2015-05-01 MED ORDER — FENTANYL CITRATE (PF) 100 MCG/2ML IJ SOLN: 25.0000 ug | INTRAMUSCULAR | Status: DC | PRN

## 2015-05-01 SURGICAL SUPPLY — 21 items

## 2015-05-01 NOTE — Transfer of Care (Signed)
Immediate Anesthesia Transfer of Care Note  Patient: Alyssa Bass  Procedure(s) Performed: Procedure(s): COLONOSCOPY WITH PROPOFOL (N/A) HOT HEMOSTASIS (ARGON PLASMA COAGULATION/BICAP) (N/A)  Patient Location: PACU and Endoscopy Unit  Anesthesia Type:MAC  Level of Consciousness: awake, oriented and patient cooperative  Airway & Oxygen Therapy: Patient Spontanous Breathing and Patient connected to face mask oxygen  Post-op Assessment: Report given to RN, Post -op Vital signs reviewed and stable and Patient moving all extremities X 4  Post vital signs: Reviewed and stable  Last Vitals:  Filed Vitals:   05/01/15 0819  BP: 138/92  Pulse: 73  Temp: 37.1 C  Resp: 16    Complications: No apparent anesthesia complications

## 2015-05-01 NOTE — Op Note (Signed)
[facility] [place], [address]   COLONOSCOPY PROCEDURE REPORT  PATIENT: Alyssa Bass, Alyssa Bass  MR#: [id number] BIRTHDATE: 05-Jul-1966 , 62  yrs. old GENDER: female ENDOSCOPIST: Laurence Spates, MD REFERRED BY:  Minna Merritts M.D. PROCEDURE DATE:  05/13/2015 PROCEDURE:   colonoscopy polypectomy ASA CLASS:   class II INDICATIONS:follow-up of large villous adenoma approximately 2- 2.5 cm in the distal AC that was removed one year ago. This procedure is done with APC on standby if needed MEDICATIONS: propofol 550 mg IV  DESCRIPTION OF PROCEDURE:   After the risks and benefits and of the procedure were explained, informed consent was obtained.  digital exam was normal         The Pentax Colon [id number]  endoscope was introduced through the anus and advanced to the Secom identified by the ICV and appendiceal orifice.      .  The quality of the prep was good      .  The instrument was then slowly withdrawn as the colon was fully examined. Estimated blood loss is zero unless otherwise noted in this procedure report. The colon was somewhat tortuous and abdominal pressure was required in order to reach the cecum. in the distal [facility] the tattoo was seen and photographed. The area was carefully examined and no residual polyp was seen. The remainder of the colon was examined and at 25 cm from the anus a 1/2 cm sessile polyp was seen and was removed with the hot snare and recovered. Small hemorrhoid seen in the rectum.   small hemorrhoids on retro flex view.          The scope was then withdrawn from the patient and the procedure completed.  WITHDRAWAL TIME: 19 minutes  COMPLICATIONS: There were no immediate complications. ENDOSCOPIC IMPRESSION: 1. No residual polyp seen in the [facility]. 2. Rectosigmoid polyp removed RECOMMENDATIONS: routine post polypectomy instructions will recommend repeating procedure in 3 years  REPEAT EXAM: 3 years  cc:Dr. Minna Merritts  _______________________________ eSigned:  Laurence Spates, MD May 13, 2015 9:56 AM   CPT CODES: ICD CODES:  The ICD and CPT codes recommended by this software are interpretations from the data that the clinical staff has captured with the software.  The verification of the translation of this report to the ICD and CPT codes and modifiers is the sole responsibility of the health care institution and practicing physician where this report was generated.  [employer] will not be held responsible for the validity of the ICD and CPT codes included on this report.  AMA assumes no liability for data contained or not contained herein. CPT is a Designer, television/film set of the [employer].   PATIENT NAME:  Alyssa Bass, Alyssa Bass MR#: [id number]

## 2015-05-01 NOTE — Anesthesia Postprocedure Evaluation (Signed)
  Anesthesia Post-op Note  Patient: Alyssa Bass  Procedure(s) Performed: Procedure(s) (LRB): COLONOSCOPY WITH PROPOFOL (N/A)  Patient Location: PACU  Anesthesia Type: MAC  Level of Consciousness: awake and alert   Airway and Oxygen Therapy: Patient Spontanous Breathing  Post-op Pain: mild  Post-op Assessment: Post-op Vital signs reviewed, Patient's Cardiovascular Status Stable, Respiratory Function Stable, Patent Airway and No signs of Nausea or vomiting  Last Vitals:  Filed Vitals:   05/01/15 0940  BP: 138/78  Pulse: 72  Temp:   Resp: 16    Post-op Vital Signs: stable   Complications: No apparent anesthesia complications

## 2015-05-01 NOTE — H&P (Signed)
Subjective:   Patient is a 49 y.o. female presents with follow-up for removal of large villous adenoma of the AC 1 year ago. This was 2 cm in size and it was not entirely clear that all had been removed. Area was tattooed. This procedures done at one year later to remove any residual polyp. APC is on standby if needed. Procedure including risks and benefits discussed in office.  Patient Active Problem List   Diagnosis Date Noted  . 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 OSA on CPAP 11/08/2013  . Other malaise and fatigue 04/17/2013  . MS (multiple sclerosis) ([place]) 04/17/2013  . Obesity (BMI 30-39.9) 04/17/2013  . Tachycardia   . OSA (obstructive sleep apnea) 11/01/2011  . Migraine 11/01/2011  . CONJUNCTIVITIS, ALLERGIC 10/12/2009  . INSULIN RESISTANCE SYNDROME 05/04/2009  . TRANSAMINASES, SERUM, ELEVATED 02/20/2009  . MULTIPLE SCLEROSIS 02/19/2009  . HYPERTENSION, BENIGN 02/19/2009   Past Medical History  Diagnosis Date  . 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 Hypertension   . Depression   . MS (multiple sclerosis) ([place])   . Tachycardia     Past Surgical History  Procedure Laterality Date  . Achilles tendon surgery      Hagland deformity  . Left ankle surgery  2004  . Colonoscopy w/ polypectomy      Prescriptions prior to admission  Medication Sig Dispense Refill Last Dose  . ibuprofen (ADVIL,MOTRIN) 200 MG tablet Take 400 mg by mouth every 6 (six) hours as needed for mild pain.   Past Month at Unknown time  . lisinopril-hydrochlorothiazide (PRINZIDE,ZESTORETIC) 20-12.5 MG per tablet TAKE 1 TABLET TWICE A DAY 180 tablet 1 05/01/2015 at 0645  . sertraline (ZOLOFT) 100 MG tablet Take 1.5 tablets (150 mg total) by mouth daily. (Patient taking differently: Take 200 mg by mouth every morning. ) 135 tablet 1 05/01/2015 at 0645  . TECFIDERA 240 MG CPDR TAKE 1 CAPSULE TWICE A DAY (PLEASE MAKE APPOINTMENT) 60 capsule 0 05/01/2015 at 0645  . varenicline  (CHANTIX CONTINUING MONTH PAK) 1 MG tablet Take 1 tablet (1 mg total) by mouth 2 (two) times daily. 60 tablet 1 Past Week at Unknown time  . fluticasone (FLONASE) 50 MCG/ACT nasal spray Place 2 sprays into both nostrils daily. As needed (Patient not taking: Reported on 04/15/2015) 16 g 3 Taking  . levocetirizine (XYZAL) 5 MG tablet Take 1 tablet (5 mg total) by mouth every evening. (Patient not taking: Reported on 04/15/2015) 30 tablet 11 Taking  . Olopatadine HCl 0.2 % SOLN Apply 1 drop to eye daily. As needed (Patient not taking: Reported on 04/15/2015) 1 Bottle 3 Taking   No Known Allergies  Social History  Substance Use Topics  . Smoking status: Current Every Day Smoker -- 1.00 packs/day    Types: Cigarettes  . Smokeless tobacco: Never Used  . Alcohol Use: 0.0 oz/week    0 Standard drinks or equivalent per week     Comment: occas.    Family History  Problem Relation Age of Onset  . Cancer      colon  . Heart attack      grandmother  . Hypertension Brother   . Hypertension Mother   . Sleep apnea Mother   . Sleep apnea Brother   . Sleep apnea Sister   . Cancer Father     pre cancerous colon     Objective:   Patient Vitals for the past 8 hrs:  BP Temp  Temp src Pulse Resp SpO2 Height Weight  05/01/15 0819 (!) 138/92 mmHg 98.7 F (37.1 C) Oral 73 16 95 % 5\' 4"  (1.626 m) 90.266 kg (199 lb)         See MD Preop evaluation      Assessment:   1. Large villous adenoma ofAC. This is done at one year follow-up to assure complete removal with APC on standby  Plan:   Colonoscopy as above

## 2015-05-01 NOTE — Addendum Note (Signed)
Addended by: Vernie Ammons. on: 05/01/2015 07:21 AM   Modules accepted: Orders

## 2015-05-01 NOTE — Discharge Instructions (Addendum)
Colonoscopy, Care After Refer to this sheet in the next few weeks. These instructions provide you with information on caring for yourself after your procedure. Your health care provider may also give you more specific instructions. Your treatment has been planned according to current medical practices, but problems sometimes occur. Call your health care provider if you have any problems or questions after your procedure. WHAT TO EXPECT AFTER THE PROCEDURE  After your procedure, it is typical to have the following:  A small amount of blood in your stool.  Moderate amounts of gas and mild abdominal cramping or bloating. HOME CARE INSTRUCTIONS  Do not drive, operate machinery, or sign important documents for 24 hours.  You may shower and resume your regular physical activities, but move at a slower pace for the first 24 hours.  Take frequent rest periods for the first 24 hours.  Walk around or put a warm pack on your abdomen to help reduce abdominal cramping and bloating.  Drink enough fluids to keep your urine clear or pale yellow.  You may resume your normal diet as instructed by your health care provider. Avoid heavy or fried foods that are hard to digest.  Avoid drinking alcohol for 24 hours or as instructed by your health care provider.  Only take over-the-counter or prescription medicines as directed by your health care provider.  If a tissue sample (biopsy) was taken during your procedure:  Do not take aspirin or blood thinners for 7 days, or as instructed by your health care provider.  Do not drink alcohol for 7 days, or as instructed by your health care provider.  Eat soft foods for the first 24 hours. SEEK MEDICAL CARE IF: You have persistent spotting of blood in your stool 2-3 days after the procedure. SEEK IMMEDIATE MEDICAL CARE IF:  You have more than a small spotting of blood in your stool.  You pass large blood clots in your stool.  Your abdomen is swollen  (distended).  You have nausea or vomiting.  You have a fever.  You have increasing abdominal pain that is not relieved with medicine.   This information is not intended to replace advice given to you by your health care provider. Make sure you discuss any questions you have with your health care provider.   Document Released: 02/09/2004 Document Revised: 04/17/2013 Document Reviewed: 03/04/2013 Elsevier Interactive Patient Education 2016 [place]. No aspirin, ibuprofen or other NSAID medications for 5 days. Office will send note or call when pathology results are obtained. Colonoscopy will be repeated based on the pathology results.

## 2015-05-04 ENCOUNTER — Encounter (HOSPITAL_COMMUNITY): Payer: Self-pay | Admitting: Gastroenterology

## 2015-06-01 ENCOUNTER — Other Ambulatory Visit: Payer: Self-pay

## 2015-06-01 DIAGNOSIS — G4733 Obstructive sleep apnea (adult) (pediatric): Secondary | ICD-10-CM

## 2015-06-01 DIAGNOSIS — Z9989 Dependence on other enabling machines and devices: Secondary | ICD-10-CM

## 2015-06-01 MED ORDER — SERTRALINE HCL 100 MG PO TABS: 150.0000 mg | ORAL_TABLET | Freq: Every day | ORAL | Status: DC

## 2015-06-06 ENCOUNTER — Other Ambulatory Visit: Payer: Self-pay | Admitting: Neurology

## 2015-06-11 ENCOUNTER — Other Ambulatory Visit: Payer: Self-pay | Admitting: Family Medicine

## 2015-07-03 ENCOUNTER — Other Ambulatory Visit: Payer: Self-pay | Admitting: Family Medicine

## 2015-07-08 ENCOUNTER — Other Ambulatory Visit: Payer: Self-pay | Admitting: Family Medicine

## 2015-07-09 ENCOUNTER — Other Ambulatory Visit: Payer: Self-pay | Admitting: Neurology

## 2015-07-10 ENCOUNTER — Other Ambulatory Visit: Payer: Self-pay

## 2015-07-13 ENCOUNTER — Other Ambulatory Visit: Payer: Self-pay | Admitting: Neurology

## 2015-07-15 ENCOUNTER — Other Ambulatory Visit: Payer: Self-pay

## 2015-07-22 ENCOUNTER — Ambulatory Visit: Payer: Commercial Managed Care - PPO | Admitting: Family Medicine

## 2015-07-27 ENCOUNTER — Ambulatory Visit: Payer: Commercial Managed Care - PPO | Admitting: Family Medicine

## 2015-08-04 ENCOUNTER — Encounter: Payer: Self-pay | Admitting: Family Medicine

## 2015-08-04 ENCOUNTER — Ambulatory Visit (INDEPENDENT_AMBULATORY_CARE_PROVIDER_SITE_OTHER): Payer: Commercial Managed Care - PPO | Admitting: Family Medicine

## 2015-08-04 VITALS — BP 132/74 | HR 92 | Wt 209.0 lb

## 2015-08-04 DIAGNOSIS — F430 Acute stress reaction: Secondary | ICD-10-CM | POA: Diagnosis not present

## 2015-08-04 DIAGNOSIS — Z720 Tobacco use: Secondary | ICD-10-CM | POA: Diagnosis not present

## 2015-08-04 DIAGNOSIS — J209 Acute bronchitis, unspecified: Secondary | ICD-10-CM | POA: Diagnosis not present

## 2015-08-04 DIAGNOSIS — J019 Acute sinusitis, unspecified: Secondary | ICD-10-CM

## 2015-08-04 MED ORDER — AMOXICILLIN 875 MG PO TABS: 875.0000 mg | ORAL_TABLET | Freq: Two times a day (BID) | ORAL | Status: DC

## 2015-08-04 MED ORDER — SERTRALINE HCL 100 MG PO TABS: 200.0000 mg | ORAL_TABLET | Freq: Every day | ORAL | Status: DC

## 2015-08-04 MED ORDER — VARENICLINE TARTRATE 0.5 MG X 11 & 1 MG X 42 PO MISC: ORAL | Status: DC

## 2015-08-04 MED ORDER — LISINOPRIL-HYDROCHLOROTHIAZIDE 20-12.5 MG PO TABS: 1.0000 | ORAL_TABLET | Freq: Every day | ORAL | Status: DC

## 2015-08-04 NOTE — Progress Notes (Signed)
   Subjective:    Patient ID: Alyssa Bass, female    DOB: 1966/02/09, 50 y.o.   MRN: [id number]  HPI Acute stress - still doing well with the zoloft.  Happy with her regime. Started exercising about a week ago. She really wants to discuss quitting smoking again.  Tob abuse - she started the Chantix last summer but ended up stopping it. She says she was in a serious about it at that time. But now she is ready to quit and would like to try the Chantix again.[name] Kitchen    Cough and sinus sxs x 1 months.  Had fever earlier in the month but that is better. ST on and off.  Cough is deep and productive.  Some sinus pressu rein her face. No fevers chills or sweats. She is not currently taking any cough or cold medication. She did initially when it started at the beginning of the month.     Review of Systems     Objective:   Physical Exam  Constitutional: She is oriented to person, place, and time. She appears well-developed and well-nourished.  HENT:  Head: Normocephalic and atraumatic.  Right Ear: External ear normal.  Left Ear: External ear normal.  Nose: Nose normal.  Mouth/Throat: Oropharynx is clear and moist.  TMs and canals are clear.   Eyes: Conjunctivae and EOM are normal. Pupils are equal, round, and reactive to light.  Neck: Neck supple. No thyromegaly present.  Cardiovascular: Normal rate, regular rhythm and normal heart sounds.   Pulmonary/Chest: Effort normal and breath sounds normal. She has no wheezes.  Lymphadenopathy:    She has no cervical adenopathy.  Neurological: She is alert and oriented to person, place, and time.  Skin: Skin is warm and dry.  Psychiatric: She has a normal mood and affect.          Assessment & Plan:  Acute stress - PHQ- 9 negative but she does complain of low energy and difficulty concentrating. And dad 7 score of 0 today. She rates her symptoms as not difficult. Will continue current regimen. If she is doing well at her follow-up in 6 months and  can consider weaning down the medication.  tob abuse - discussed options. She would like to use the Chantix again. New prescription sent to the pharmacy.  Acute sinusitis/bronchitis-lung exam is actually fairly clear today and reassuring. We'll go ahead and treat with amoxicillin. Call if not better in one week.

## 2015-08-05 ENCOUNTER — Other Ambulatory Visit: Payer: Self-pay | Admitting: Family Medicine

## 2015-08-24 ENCOUNTER — Telehealth: Payer: Self-pay | Admitting: Neurology

## 2015-08-24 MED ORDER — DIMETHYL FUMARATE 240 MG PO CPDR: DELAYED_RELEASE_CAPSULE | ORAL | Status: DC

## 2015-08-24 NOTE — Telephone Encounter (Signed)
Patient advised phone number to call [facility] [phone].

## 2015-08-24 NOTE — Telephone Encounter (Signed)
Patient called to request refill of TECFIDERA 240 MG CPDR, states she only has enough until Friday, states [place] advised to call in verbally, would be much quicker than faxing in.

## 2015-08-24 NOTE — Telephone Encounter (Signed)
[facility] [phone] called to request refill of TECFIDERA

## 2015-08-24 NOTE — Telephone Encounter (Signed)
Patient has appt scheduled in March.  I called the pharmacy back.  Spoke with Otila Kluver.  Verified order verbally.  They will fill as prescribed and call us back if anything further is needed.

## 2015-09-05 ENCOUNTER — Other Ambulatory Visit: Payer: Self-pay | Admitting: Neurology

## 2015-09-07 ENCOUNTER — Encounter: Payer: Self-pay | Admitting: Nurse Practitioner

## 2015-09-07 ENCOUNTER — Telehealth: Payer: Self-pay | Admitting: *Deleted

## 2015-09-07 ENCOUNTER — Ambulatory Visit (INDEPENDENT_AMBULATORY_CARE_PROVIDER_SITE_OTHER): Payer: Commercial Managed Care - PPO | Admitting: Nurse Practitioner

## 2015-09-07 VITALS — BP 116/88 | HR 68 | Ht 64.0 in | Wt 211.0 lb

## 2015-09-07 DIAGNOSIS — G4733 Obstructive sleep apnea (adult) (pediatric): Secondary | ICD-10-CM

## 2015-09-07 DIAGNOSIS — I10 Essential (primary) hypertension: Secondary | ICD-10-CM

## 2015-09-07 DIAGNOSIS — Z9989 Dependence on other enabling machines and devices: Secondary | ICD-10-CM

## 2015-09-07 DIAGNOSIS — G35 Multiple sclerosis: Secondary | ICD-10-CM

## 2015-09-07 MED ORDER — OLOPATADINE HCL 0.2 % OP SOLN: 1.0000 [drp] | Freq: Every day | OPHTHALMIC | Status: DC

## 2015-09-07 MED ORDER — FLUTICASONE PROPIONATE 50 MCG/ACT NA SUSP: 2.0000 | Freq: Every day | NASAL | Status: DC

## 2015-09-07 MED ORDER — VARENICLINE TARTRATE 1 MG PO TABS: 1.0000 mg | ORAL_TABLET | Freq: Two times a day (BID) | ORAL | Status: DC

## 2015-09-07 MED ORDER — DIMETHYL FUMARATE 240 MG PO CPDR: DELAYED_RELEASE_CAPSULE | ORAL | Status: DC

## 2015-09-07 MED ORDER — LEVOCETIRIZINE DIHYDROCHLORIDE 5 MG PO TABS: 5.0000 mg | ORAL_TABLET | Freq: Every evening | ORAL | Status: DC

## 2015-09-07 NOTE — Patient Instructions (Signed)
Continue Tecfidera 240 mg BID. Check CMP, CBC wDiff,. Follow up in 1year, with Dr. Brett Fairy, sooner as needed.

## 2015-09-07 NOTE — Telephone Encounter (Signed)
Pt called and lvm stating that she was seen by her neurologist and was told that her vertigo was possibly coming from her allergies and that she would need to contact her pcp for refills for this.Alyssa Bass [place]

## 2015-09-07 NOTE — Telephone Encounter (Signed)
Medications refilled.Alyssa Bass [name]

## 2015-09-07 NOTE — Progress Notes (Signed)
[facility]  PATIENT: Alyssa Bass DOB: Nov 29, 1965   REASON FOR VISIT: Follow-up for multiple sclerosis, obstructive sleep apnea HISTORY FROM: Patient    HISTORY OF PRESENT ILLNESS: Alyssa Bass, 50 year old female returns for follow-up. She was last seen in the office by [name] nurse practitioner 05/12/2014. She has a history of multiple sclerosis and obstructive sleep apnea. She is currently on tecfidera with occasional flushing. She has seasonal allergies has had some positional dizziness recently. She has not restarted her allergy medication. She is also on CPAP but did not bring her chip today. Her equipment company is  [facility]. She is in the process of getting a divorce. She returns for reevaluation   05/12/14 (LL): Alyssa Bass returns for 6 month revisit, she has been doing well on Tecfidera, no relapses or new symptoms since last visit. Has flushing as her only side effect which she states is random times. She continues to feel that her memory is not what it once was and has occasional positional dizziness, but does not have it at this time. She continues to have mild numbness in her right fingers, this has been long-standing. She feels much better since switching to Adderall XR taking one a day because she often forget the second dose of the immediate release, when she remembered it was too late in the day to take it. I reviewed the patient's CPAP compliance data from 02/11/14 to 05/11/14, which is a total of 90 days, during which time the patient used CPAP every day except for 1 day. The average usage for all days was 8 hours and 2 minutes. The percent used days greater than 4 hours was 98%, indicating excellent compliance. The residual AHI was 0.2 per hour, indicating an good treatment pressure of 10 cwp with EPR of 3. Air leak from the mask was median 0.8. She is using the nasal pillow, but has a concern with the mask strap causing hair loss behind the  temples. She endorses a huge improvement in her sleep using he cpap.  01-03-14 CD  I see Alyssa Bass today for a follow up on fatigue and excessive daytime sleepiness.  The patient is on CPAP for OSA and was followed by a [facility], Dr. Mickel Fuchs saw her. The dependence got closed.  She was diagnosed based on a overall AHI of only 7.8 and REM dependent AHi 22.4 with desaturations in oxygen.  CPAP has been used at 10 cm water since, with a nasal mask. She doesn't like the straps on her head.  She would be delighted to get a new headset - and was happy to hear her download and compliance reviewed as high.  The download encompasses 453 days since the last download !. The patient has a 96% compliance, she was daily by time is 7 hours and 46 minutes and the residual AHI is only 0.2.  This is an almost complete resolution of apnea with one of the highest compliance is. Set pressures 10 cm to 3 cm EPR. She still has a moderate air leak.  I would like for her to try a nasal pillow .  11/08/13 (LL): Alyssa Bass returns for followup, she has been on Tecfidera for about 4 months and loves it. She has a short episode of flushing at 11 am daily, but that is the only side effect. She has been doing well, except still very fatigued; she takes 50 mg adderall in the morning, and 25 mg after lunch  on some days, and is very tired in the afternoons and must go to bed by 9 pm each night. She wears cpap each night and previously saw a MD at [facility] in [place] for this; the office has since closed, and she wishes to to be managed by Dr. Brett Fairy since she already comes to out office for MS.  PRIOR HPI (CD): after delivering a healthy baby daughter. A spinal tap had revealed only one band, but she developed optic neuritis in 2000, when she developed sensory symptoms.  A repeat CSF test revealed than in 2000 the oligoclonal bands and established the diagnosis.  Alyssa Bass, a former patient of  Dr. Morene Antu, has been followed by him for her multiple sclerosis- but she also carries a diagnosis of an AVM and intra-ventricular cyst.  To screen for progression in both diseases she has undergone regular MRIs. She is also followed at [facility] where she was seen, from where the more recent imaging studies are available .  I have a copy here of her last MRI from 9 2012 showing that the patient has multiple foci of periventricular and deep white matter lesions but predominantly perivascular.  Her white matter disease has gradually progressed since the Baseline image and examination in 2002.  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 There was also no significant change in the left frontal venous angioma.  She has had elevated LFTs in the past, and a positive JC Virus antibody screen.   REVIEW OF SYSTEMS: Full 14 system review of systems performed and notable only for those listed, all others are neg:  Constitutional: neg  Cardiovascular: neg Ear/Nose/Throat: neg  Skin: neg Eyes: neg Respiratory: neg Gastroitestinal: neg  Hematology/Lymphatic: neg  Endocrine: neg Musculoskeletal:neg Allergy/Immunology: Environmental allergies Neurological: Dizziness, numbness Psychiatric: neg Sleep : Obstructive sleep apnea   ALLERGIES: No Known Allergies  HOME MEDICATIONS: Outpatient Prescriptions Prior to Visit  Medication Sig Dispense Refill  . fluticasone (FLONASE) 50 MCG/ACT nasal spray Place 2 sprays into both nostrils daily. As needed 16 g 3  . levocetirizine (XYZAL) 5 MG tablet Take 1 tablet (5 mg total) by mouth every evening. 30 tablet 11  . lisinopril-hydrochlorothiazide (PRINZIDE,ZESTORETIC) 20-12.5 MG tablet Take 1 tablet by mouth daily. 1 tablet 0  . Olopatadine HCl 0.2 % SOLN Apply 1 drop to eye daily. As needed 1 Bottle 3  . sertraline (ZOLOFT) 100 MG tablet Take 2 tablets (200 mg  total) by mouth daily. 180 tablet 1  . varenicline (CHANTIX STARTING MONTH PAK) 0.5 MG X 11 & 1 MG X 42 tablet Take one 0.5 mg tablet by mouth once daily for 3 days, then increase to one 0.5 mg tablet twice daily for 4 days, then increase to one 1 mg tablet twice daily. 53 tablet 0  . amoxicillin (AMOXIL) 875 MG tablet Take 1 tablet (875 mg total) by mouth 2 (two) times daily. 20 tablet 0  . Dimethyl Fumarate (TECFIDERA) 240 MG CPDR TAKE 1 CAPSULE (240 MG) TWICE A DAY 60 capsule 0  . ibuprofen (ADVIL,MOTRIN) 200 MG tablet Take 400 mg by mouth every 6 (six) hours as needed for mild pain. Reported on 09/07/2015     No facility-administered medications prior to visit.    PAST MEDICAL HISTORY: Past Medical History  Diagnosis Date  . 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 Hypertension   . Depression   . MS (multiple sclerosis) ([place])   . Tachycardia     PAST SURGICAL HISTORY: Past Surgical History  Procedure Laterality Date  . Achilles tendon surgery      Hagland deformity  . Left ankle surgery  2004  . Colonoscopy w/ polypectomy    . Colonoscopy with propofol N/A 05/01/2015    Procedure: COLONOSCOPY WITH PROPOFOL;  Surgeon: Laurence Spates, MD;  Location: [facility];  Service: Endoscopy;  Laterality: N/A;    FAMILY HISTORY: Family History  Problem Relation Age of Onset  . Cancer      colon  . Heart attack      grandmother  . Hypertension Brother   . Hypertension Mother   . Sleep apnea Mother   . Sleep apnea Brother   . Sleep apnea Sister   . Cancer Father     pre cancerous colon    SOCIAL HISTORY: Social History   Social History  . Marital Status: Legally Separated    Spouse Name: Elta Guadeloupe  . Number of Children: 2  . Years of Education: 14   Occupational History  .      Statistician   Social History Main Topics  . Smoking status: Former Smoker -- 1.00 packs/day    Types: Cigarettes    Quit date: 08/11/2015  . Smokeless tobacco: Never Used  . Alcohol  Use: 0.0 oz/week    0 Standard drinks or equivalent per week     Comment: occas.  . Drug Use: No  . Sexual Activity: Not on file   Other Topics Concern  . 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   Patient has two children.   Patient is working full-time.   Patient has a college degree.   Patient is right-handed.   Patient drinks one cup of coffee daily.           PHYSICAL EXAM  Filed Vitals:   09/07/15 0830  BP: 116/88  Pulse: 68  Height: 5\' 4"  (1.626 m)  Weight: 211 lb (95.709 kg)   Body mass index is 36.2 kg/(m^2). Generalized: Well developed, obese female in no acute distress  Head: normocephalic and atraumatic. Oropharynx benign  Neck: Supple, no carotid bruits  Cardiac: Regular rate rhythm, no murmur  Musculoskeletal: No deformity   Neurological examination  Mentation: Alert oriented to time, place, history taking. Follows all commands speech and language fluent Cranial nerve II-XII: Pupils were equal round reactive to light extraocular movements were full, visual field were full on confrontational test. Facial sensation and strength were normal. hearing was intact to finger rubbing bilaterally. Uvula tongue midline. head turning and shoulder shrug and were normal and symmetric. Tongue protrusion into cheek strength was normal. Motor: The motor testing reveals 5 over 5 strength of all 4 extremities. Good symmetric motor tone is noted throughout.  Sensory: Sensory testing is intact to pinprick, soft touch, vibration sensation, and position sense on all 4 extremities. Mild sensory deficit in her right fingertips. No evidence of extinction is noted.  Coordination: Cerebellar testing reveals good finger-nose-fingerbilaterally.  Gait and station: Gait is normal. Tandem gait is normal. Romberg is negative. No drift is seen.  Reflexes: Deep tendon reflexes are symmetric and normal  bilaterally.   DIAGNOSTIC DATA (LABS, IMAGING, TESTING) -    Component Value Date/Time   NA 139 01/19/2015 0834   NA  138 05/12/2014 0913   K 4.2 01/19/2015 0834   CL 102 01/19/2015 0834   CO2 24 01/19/2015 0834   GLUCOSE 99 01/19/2015 0834   GLUCOSE 98 05/12/2014 0913   BUN 10 01/19/2015 0834   BUN 15 05/12/2014 0913   CREATININE 0.58 01/19/2015 0834   CREATININE 0.69 05/12/2014 0913   CALCIUM 9.3 01/19/2015 0834   PROT 6.8 05/12/2014 0913   PROT 7.3 01/01/2013 0825   ALBUMIN 4.3 05/12/2014 0913   ALBUMIN 3.9 01/01/2013 0825   AST 15 05/12/2014 0913   ALT 12 05/12/2014 0913   ALKPHOS 54 05/12/2014 0913   BILITOT 0.2 05/12/2014 0913   GFRNONAA 105 05/12/2014 0913   GFRNONAA >89 11/21/2012 0900   GFRAA 121 05/12/2014 0913   GFRAA >89 11/21/2012 0900   Lab Results  Component Value Date   CHOL 177 01/19/2015   HDL 64 01/19/2015   LDLCALC 94 01/19/2015   TRIG 93 01/19/2015   CHOLHDL 2.8 01/19/2015   ASSESSMENT AND PLAN  50 y.o. year old female   has a past medical history of  Hypertension; Depression; MS (multiple sclerosis); Tachycardia, and OSA on CPAP here for follow up of RRMS. Patient on Tecifidera after switching off Rebif. No headaches. Less fatigue.   PLAN: Continue Tecfidera 240 mg BID.will renew, discussed the importance of taking the medication with a full meal and protein as she occasionally has flushing Check CMP, CBC wDiff,. To monitor adverse effects of tecfidera Will attempt to get download from [facility] Follow up in 1year, with Dr. Brett Fairy, sooner as needed. Alyssa Bass, [facility], [facility], APRN  [facility] [address] [place], [place] [address] [phone]

## 2015-09-07 NOTE — Progress Notes (Signed)
I agree with the assessment and plan as directed by NP .The patient is known to me .   Santana Edell, MD  

## 2015-09-08 ENCOUNTER — Telehealth: Payer: Self-pay | Admitting: Nurse Practitioner

## 2015-09-08 LAB — CBC WITH DIFFERENTIAL/PLATELET
BASOS ABS: 0 10*3/uL (ref 0.0–0.2)
BASOS: 0 %
EOS (ABSOLUTE): 0.2 10*3/uL (ref 0.0–0.4)
Eos: 3 %
Hematocrit: 39.3 % (ref 34.0–46.6)
Hemoglobin: 13.3 g/dL (ref 11.1–15.9)
IMMATURE GRANS (ABS): 0 10*3/uL (ref 0.0–0.1)
Immature Granulocytes: 0 %
LYMPHS ABS: 1.1 10*3/uL (ref 0.7–3.1)
LYMPHS: 19 %
MCH: 32.7 pg (ref 26.6–33.0)
MCHC: 33.8 g/dL (ref 31.5–35.7)
MCV: 97 fL (ref 79–97)
MONOS ABS: 0.5 10*3/uL (ref 0.1–0.9)
Monocytes: 9 %
NEUTROS ABS: 3.9 10*3/uL (ref 1.4–7.0)
Neutrophils: 69 %
PLATELETS: 290 10*3/uL (ref 150–379)
RBC: 4.07 x10E6/uL (ref 3.77–5.28)
RDW: 12.8 % (ref 12.3–15.4)
WBC: 5.8 10*3/uL (ref 3.4–10.8)

## 2015-09-08 LAB — COMPREHENSIVE METABOLIC PANEL
A/G RATIO: 1.7 (ref 1.1–2.5)
ALBUMIN: 4.2 g/dL (ref 3.5–5.5)
ALK PHOS: 53 IU/L (ref 39–117)
ALT: 12 IU/L (ref 0–32)
AST: 14 IU/L (ref 0–40)
BILIRUBIN TOTAL: 0.2 mg/dL (ref 0.0–1.2)
BUN/Creatinine Ratio: 26 — ABNORMAL HIGH (ref 9–23)
BUN: 18 mg/dL (ref 6–24)
CHLORIDE: 101 mmol/L (ref 96–106)
CO2: 24 mmol/L (ref 18–29)
Calcium: 9.6 mg/dL (ref 8.7–10.2)
Creatinine, Ser: 0.69 mg/dL (ref 0.57–1.00)
GFR calc non Af Amer: 104 mL/min/{1.73_m2} (ref >59)
GFR, EST AFRICAN AMERICAN: 120 mL/min/{1.73_m2} (ref >59)
GLUCOSE: 103 mg/dL — AB (ref 65–99)
Globulin, Total: 2.5 g/dL (ref 1.5–4.5)
POTASSIUM: 5.6 mmol/L — AB (ref 3.5–5.2)
SODIUM: 141 mmol/L (ref 134–144)
Total Protein: 6.7 g/dL (ref 6.0–8.5)

## 2015-09-08 NOTE — Telephone Encounter (Signed)
-----   Message from Dennie Bible, NP sent at 09/08/2015  7:55 AM EST ----- Labs good please call the patient

## 2015-09-08 NOTE — Progress Notes (Signed)
Quick Note:  Called and spoke to patient relayed labs looked good. Patient understood. ______

## 2015-09-08 NOTE — Telephone Encounter (Signed)
Called and spoke to patient relayed labs looked good. Patient was happy and she understood.

## 2015-09-14 ENCOUNTER — Telehealth: Payer: Self-pay | Admitting: *Deleted

## 2015-09-14 NOTE — Telephone Encounter (Signed)
I called and LMVM  mobile for pt to return call to let us know what she will do regarding getting compliance DL on her cpap.  I call [facility] and they do not have recent (theres is over 1 yr).  I gave her options of bringing card to [place], her going to [facility] , or to mail card to them.  I asked her to call us back to let us know what she will do.

## 2015-09-17 ENCOUNTER — Ambulatory Visit: Payer: Commercial Managed Care - PPO | Admitting: Neurology

## 2015-09-23 NOTE — Telephone Encounter (Signed)
I called and spoke to pt and she will bring by the SD card this Friday for DL.

## 2015-11-09 ENCOUNTER — Other Ambulatory Visit: Payer: Self-pay | Admitting: Family Medicine

## 2015-11-11 ENCOUNTER — Other Ambulatory Visit: Payer: Self-pay | Admitting: Family Medicine

## 2015-11-16 ENCOUNTER — Other Ambulatory Visit: Payer: Self-pay | Admitting: Family Medicine

## 2015-12-09 ENCOUNTER — Other Ambulatory Visit: Payer: Self-pay | Admitting: Family Medicine

## 2015-12-11 ENCOUNTER — Other Ambulatory Visit: Payer: Self-pay | Admitting: Family Medicine

## 2016-02-01 ENCOUNTER — Other Ambulatory Visit: Payer: Self-pay | Admitting: Family Medicine

## 2016-02-11 ENCOUNTER — Other Ambulatory Visit: Payer: Self-pay | Admitting: *Deleted

## 2016-02-11 MED ORDER — VARENICLINE TARTRATE 1 MG PO TABS: 1.0000 mg | ORAL_TABLET | Freq: Two times a day (BID) | ORAL | 1 refills | Status: DC

## 2016-02-11 NOTE — Telephone Encounter (Signed)
Pt called and stated that she had a set back with her smoking cessation and is asking to restart the chantix. rx sent .Alyssa Bass [place]

## 2016-02-19 ENCOUNTER — Other Ambulatory Visit: Payer: Self-pay | Admitting: Family Medicine

## 2016-05-02 ENCOUNTER — Other Ambulatory Visit: Payer: Self-pay | Admitting: Family Medicine

## 2016-06-09 ENCOUNTER — Other Ambulatory Visit: Payer: Self-pay

## 2016-06-09 MED ORDER — SERTRALINE HCL 100 MG PO TABS: 200.0000 mg | ORAL_TABLET | Freq: Every day | ORAL | 0 refills | Status: DC

## 2016-06-20 ENCOUNTER — Ambulatory Visit (INDEPENDENT_AMBULATORY_CARE_PROVIDER_SITE_OTHER): Payer: Commercial Managed Care - PPO | Admitting: Family Medicine

## 2016-06-20 ENCOUNTER — Encounter: Payer: Self-pay | Admitting: Family Medicine

## 2016-06-20 VITALS — BP 124/64 | HR 85 | Ht 64.0 in | Wt 220.0 lb

## 2016-06-20 DIAGNOSIS — F430 Acute stress reaction: Secondary | ICD-10-CM

## 2016-06-20 DIAGNOSIS — I10 Essential (primary) hypertension: Secondary | ICD-10-CM

## 2016-06-20 DIAGNOSIS — R7301 Impaired fasting glucose: Secondary | ICD-10-CM | POA: Diagnosis not present

## 2016-06-20 LAB — COMPLETE METABOLIC PANEL WITH GFR
ALK PHOS: 59 U/L (ref 33–115)
ALT: 12 U/L (ref 6–29)
AST: 12 U/L (ref 10–35)
Albumin: 4.1 g/dL (ref 3.6–5.1)
BILIRUBIN TOTAL: 0.4 mg/dL (ref 0.2–1.2)
BUN: 17 mg/dL (ref 7–25)
CALCIUM: 9.4 mg/dL (ref 8.6–10.2)
CO2: 27 mmol/L (ref 20–31)
CREATININE: 0.62 mg/dL (ref 0.50–1.10)
Chloride: 104 mmol/L (ref 98–110)
Glucose, Bld: 102 mg/dL — ABNORMAL HIGH (ref 65–99)
Potassium: 4.5 mmol/L (ref 3.5–5.3)
Sodium: 138 mmol/L (ref 135–146)
TOTAL PROTEIN: 6.8 g/dL (ref 6.1–8.1)

## 2016-06-20 LAB — POCT GLYCOSYLATED HEMOGLOBIN (HGB A1C): Hemoglobin A1C: 6

## 2016-06-20 LAB — LIPID PANEL
CHOLESTEROL: 188 mg/dL (ref <200)
HDL: 63 mg/dL (ref >50)
LDL CALC: 109 mg/dL — AB (ref <100)
TRIGLYCERIDES: 82 mg/dL (ref <150)
Total CHOL/HDL Ratio: 3 Ratio (ref <5.0)
VLDL: 16 mg/dL (ref <30)

## 2016-06-20 MED ORDER — LISINOPRIL-HYDROCHLOROTHIAZIDE 20-12.5 MG PO TABS: 1.0000 | ORAL_TABLET | Freq: Every day | ORAL | 0 refills | Status: DC

## 2016-06-20 MED ORDER — SERTRALINE HCL 100 MG PO TABS: 200.0000 mg | ORAL_TABLET | Freq: Every day | ORAL | 1 refills | Status: DC

## 2016-06-20 MED ORDER — SERTRALINE HCL 100 MG PO TABS: 200.0000 mg | ORAL_TABLET | Freq: Every day | ORAL | 0 refills | Status: DC

## 2016-06-20 MED ORDER — VARENICLINE TARTRATE 0.5 MG X 11 & 1 MG X 42 PO MISC: ORAL | 0 refills | Status: DC

## 2016-06-20 MED ORDER — LISINOPRIL-HYDROCHLOROTHIAZIDE 20-12.5 MG PO TABS: 1.0000 | ORAL_TABLET | Freq: Every day | ORAL | 1 refills | Status: DC

## 2016-06-20 NOTE — Progress Notes (Signed)
   Subjective:    Patient ID: Alyssa Bass, female    DOB: 23-Sep-1965, 50 y.o.   MRN: [id number]  HPI F/U acute stress - She is doing well overall. Denies little interest or pleasure in doing things. She does feel down occasionally but not very often. She does feel like she overeats at times and feels little bit more tired at times.  tob abuse - She would like to restart Chantix. The old prescription still had a refill but she realizes when she got home that was actually the continuing pack and not the starter pack.  Impaired fasting glucose-no increased thirst or urination. No symptoms consistent with hypoglycemia.    Dr. Garwin Brothers does her paps and mammogram.    Review of Systems     Objective:   Physical Exam  Constitutional: She is oriented to person, place, and time. She appears well-developed and well-nourished.  HENT:  Head: Normocephalic and atraumatic.  Cardiovascular: Normal rate, regular rhythm and normal heart sounds.   Pulmonary/Chest: Effort normal and breath sounds normal.  Neurological: She is alert and oriented to person, place, and time.  Skin: Skin is warm and dry.  Psychiatric: She has a normal mood and affect. Her behavior is normal.        Assessment & Plan:  Tob Abuse - Will restart Chantix. Will send over new starter pack to the pharmacy. Since she just filled the continuing pack they may or may not allow her to do so. She can always start with half of a tab twice a day on the continuing pack and after one week and will whole tab if needed.  IFG - A1c is stable. Continue to monitor every 6 months. Continue work on [employer] and regular exercise. Lab Results  Component Value Date   HGBA1C 6.0 06/20/2016    Acute stress - PHQ-9 score of 3 today. Doing well overall.  Continue sertraline. Was sent to mail order. Consider discontinuing med at follow-up visit if she still doing really well.

## 2016-08-01 ENCOUNTER — Encounter: Payer: Self-pay | Admitting: Family Medicine

## 2016-08-01 ENCOUNTER — Ambulatory Visit (INDEPENDENT_AMBULATORY_CARE_PROVIDER_SITE_OTHER): Payer: Commercial Managed Care - PPO | Admitting: Family Medicine

## 2016-08-01 VITALS — BP 133/53 | HR 71 | Temp 98.2°F | Ht 64.0 in | Wt 225.0 lb

## 2016-08-01 DIAGNOSIS — H6591 Unspecified nonsuppurative otitis media, right ear: Secondary | ICD-10-CM

## 2016-08-01 MED ORDER — AMOXICILLIN-POT CLAVULANATE 875-125 MG PO TABS: 1.0000 | ORAL_TABLET | Freq: Two times a day (BID) | ORAL | 0 refills | Status: DC

## 2016-08-01 NOTE — Patient Instructions (Signed)
Recommend alternating ibuprofen and Tylenol every 4 hours for pain control.

## 2016-08-01 NOTE — Progress Notes (Signed)
   Subjective:    Patient ID: Alyssa Bass, female    DOB: March 21, 1966, 51 y.o.   MRN: [id number]  HPI Patient comes in with right ear pain. She says she feels like she can't hear out of it very well and it's painful. She was diagnosed with acute sinusitis about a week ago. She was placed on azithromycin and completed her course of antibiotics. She said her ear didn't start hurting until after she finished the antibiotics. No drainage from the ear. No fevers chills or sweats.  Did have a flu shot this year.    Review of Systems     Objective:   Physical Exam  Constitutional: She is oriented to person, place, and time. She appears well-developed and well-nourished.  HENT:  Head: Normocephalic and atraumatic.  Right Ear: External ear normal.  Left Ear: External ear normal.  Nose: Nose normal.  Mouth/Throat: Oropharynx is clear and moist.  Left TM and canal is clear. Right TM is erythematous with effusion and distortion of the ossicle.  Some mild erythema of the canal as well.   Eyes: Conjunctivae and EOM are normal. Pupils are equal, round, and reactive to light.  Neck: Neck supple. No thyromegaly present.  Cardiovascular: Normal rate, regular rhythm and normal heart sounds.   Pulmonary/Chest: Effort normal and breath sounds normal. She has no wheezes.  Lymphadenopathy:    She has no cervical adenopathy.  Neurological: She is alert and oriented to person, place, and time.  Skin: Skin is warm and dry.  Psychiatric: She has a normal mood and affect.          Assessment & Plan:  Acute otitis media with effusion-we'll treat with Augmentin. If not better in one week please call. Recommend alternating ibuprofen and Tylenol for pain control.

## 2016-08-05 ENCOUNTER — Telehealth: Payer: Self-pay | Admitting: Family Medicine

## 2016-08-05 MED ORDER — PREDNISONE 20 MG PO TABS: 40.0000 mg | ORAL_TABLET | Freq: Every day | ORAL | 0 refills | Status: DC

## 2016-08-05 NOTE — Telephone Encounter (Signed)
Call patient: Will call in a course of prednisone. Try to complete the antibiotic. Recommend that she come in on Monday if she is not feeling better. Beatrice Lecher, MD

## 2016-08-05 NOTE — Telephone Encounter (Signed)
Pt advised and scheduled for Monday.

## 2016-08-05 NOTE — Telephone Encounter (Signed)
Patient called clinic stating her ear is worse. She was seen in office on Monday and started on antibiotics. Now her ear is popping and she cant hear out of it. Will route to PCP for review.

## 2016-08-08 ENCOUNTER — Ambulatory Visit: Payer: Commercial Managed Care - PPO | Admitting: Family Medicine

## 2016-08-09 ENCOUNTER — Other Ambulatory Visit: Payer: Self-pay | Admitting: *Deleted

## 2016-08-09 ENCOUNTER — Other Ambulatory Visit: Payer: Self-pay | Admitting: Family Medicine

## 2016-08-09 MED ORDER — VARENICLINE TARTRATE 1 MG PO TABS: 1.0000 mg | ORAL_TABLET | Freq: Two times a day (BID) | ORAL | 1 refills | Status: DC

## 2016-08-10 ENCOUNTER — Other Ambulatory Visit: Payer: Self-pay | Admitting: Family Medicine

## 2016-08-11 ENCOUNTER — Other Ambulatory Visit: Payer: Self-pay | Admitting: Nurse Practitioner

## 2016-08-12 ENCOUNTER — Ambulatory Visit: Payer: Commercial Managed Care - PPO | Admitting: Family Medicine

## 2016-08-14 ENCOUNTER — Other Ambulatory Visit: Payer: Self-pay | Admitting: Family Medicine

## 2016-09-07 ENCOUNTER — Ambulatory Visit (INDEPENDENT_AMBULATORY_CARE_PROVIDER_SITE_OTHER): Payer: Commercial Managed Care - PPO | Admitting: Neurology

## 2016-09-07 ENCOUNTER — Encounter: Payer: Self-pay | Admitting: Neurology

## 2016-09-07 VITALS — BP 108/64 | HR 72 | Resp 16 | Ht 64.0 in | Wt 226.0 lb

## 2016-09-07 DIAGNOSIS — Z9989 Dependence on other enabling machines and devices: Secondary | ICD-10-CM

## 2016-09-07 DIAGNOSIS — G35 Multiple sclerosis: Secondary | ICD-10-CM | POA: Diagnosis not present

## 2016-09-07 DIAGNOSIS — E6609 Other obesity due to excess calories: Secondary | ICD-10-CM | POA: Diagnosis not present

## 2016-09-07 DIAGNOSIS — G4733 Obstructive sleep apnea (adult) (pediatric): Secondary | ICD-10-CM | POA: Diagnosis not present

## 2016-09-07 MED ORDER — AMPHETAMINE-DEXTROAMPHET ER 25 MG PO CP24: 25.0000 mg | ORAL_CAPSULE | ORAL | 0 refills | Status: DC

## 2016-09-07 MED ORDER — DIMETHYL FUMARATE 240 MG PO CPDR: 1.0000 | DELAYED_RELEASE_CAPSULE | Freq: Two times a day (BID) | ORAL | 3 refills | Status: DC

## 2016-09-07 NOTE — Addendum Note (Signed)
Addended by: Larey Seat on: 09/07/2016 09:02 AM   Modules accepted: Orders

## 2016-09-07 NOTE — Progress Notes (Signed)
SLEEP MEDICINE CLINIC   Provider:  Larey Seat, M D  Referring Provider: Hali Bass, * Primary Care Physician:  Alyssa Lecher, MD  Chief Complaint  Patient presents with  . Follow-up    Rm 10. No new concerns per patient. States that she is doing well on CPAP. Tolerates Tecfidera well.     HPI:  Alyssa Bass is a 51 y.o. female , seen here as a revisit  from Dr. Madilyn Fireman for MS and OSA follow up,   Chief complaint according to patient :  Sleep habits are as follows:       Sleep medical history and family sleep history:    Social history: REASON FOR VISIT: Follow-up for multiple sclerosis, obstructive sleep apnea HISTORY FROM: Patient     HISTORY OF PRESENT ILLNESS:   (CD): after delivering a healthy baby daughter. A spinal tap had revealed only one band, but she developed optic neuritis in 2000, when she developed sensory symptoms.  A repeat CSF test revealed than in 2000 the oligoclonal bands and established the diagnosis.  Alyssa Bass, a former patient of Dr. Morene Antu, has been followed by him for her multiple sclerosis- but she also carries a diagnosis of an AVM and intra-ventricular cyst.  To screen for progression in both diseases she has undergone regular MRIs. She is also followed at [facility] where she was seen, from where the more recent imaging studies are available .  I have a copy here of her last MRI from 9 2012 showing that the patient has multiple foci of periventricular and deep white matter lesions but predominantly perivascular.  Her white matter disease has gradually progressed since the Baseline image and examination in 2002.  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 There was also no significant change in the left frontal venous angioma.  She has had elevated LFTs in the past, and a positive JC Virus antibody screen.    05/12/14 (LL): Alyssa Bass returns for 6 month revisit, she has been doing well on Tecfidera, no relapses or new symptoms since last visit. Has flushing as her only side effect which she states is random times. She continues to feel that her memory is not what it once was and has occasional positional dizziness, but does not have it at this time. She continues to have mild numbness in her right fingers, this has been long-standing. She feels much better since switching to Adderall XR taking one a day because she often forget the second dose of the immediate release, when she remembered it was too late in the day to take it. I reviewed the patient's CPAP compliance data from 02/11/14 to 05/11/14, which is a total of 90 days, during which time the patient used CPAP every day except for 1 day. The average usage for all days was 8 hours and 2 minutes. The percent used days greater than 4 hours was 98%, indicating excellent compliance. The residual AHI was 0.2 per hour, indicating an good treatment pressure of 10 cwp with EPR of 3. Air leak from the mask was median 0.8. She is using the nasal pillow, but has a concern with the mask strap causing hair loss behind the temples. She endorses a huge improvement in her sleep using he cpap.  01-03-14 CD  I see Alyssa Bass today for a follow up on fatigue and excessive daytime  sleepiness.  The patient is on CPAP for OSA and was followed by a [facility] sleep [facility], Dr. Mickel Fuchs saw her. The dependence got closed.  She was diagnosed based on a overall AHI of only 7.8 and REM dependent AHi 22.4 with desaturations in oxygen.  CPAP has been used at 10 cm water since, with a nasal mask. She doesn't like the straps on her head.  She would be delighted to get a new headset - and was happy to hear her download and compliance reviewed as high.  The download encompasses 453 days since the last download !. The patient has a 96% compliance, she was daily by time is 7  hours and 46 minutes and the residual AHI is only 0.2.  This is an almost complete resolution of apnea with one of the highest compliance is. Set pressures 10 cm to 3 cm EPR. She still has a moderate air leak.  I would like for her to try a nasal pillow .   11/08/13 (LL): Alyssa Bass returns for followup, she has been on Tecfidera for about 4 months and loves it.   Interval history from 09/07/2016, I have pleasure of seeing Alyssa Bass after a 2 year hiatus. She has been followed by our nurse practitioners is a compliant CPAP user and here today for a compliance visit as well is discussing her MS status, she continues to be very happy with tecfidera treatments and has only occasionally flushing. The patient's download from her CPAP today reveals a residual apnea index of only 0.697% compliance for days and 90% compliance for over 4 hours of consecutive use, with average user time of 6 hours and 54 minutes. CPAP is set at 10 cm water with 3 cm expiratory pressure relief. Epworth sleepiness score was endorsed at 6 points fatigue severity score at 32 points. The patient reports that her divorce with stressful understandably, that her blood pressure and heart rate went up during times of legal fights, she paused her Adderall medication. She now has found her new baseline, continues to work productively and feels that she needs a refill on Adderall. Interestingly, she reports that Adderall increases her appetite. Her last MRI was  2-3 years ago., she reports memory decline.      Review of Systems: Out of a complete 14 system review, the patient complains of only the following symptoms, and all other reviewed systems are negative.  Memory loss,  Weight gain, HTN, inattentiveness, unable to focus.   Social History   Social History  . Marital status: Legally Separated    Spouse name: Alyssa Bass  . Number of children: 2  . Years of education: 14   Occupational History  .  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 Smoking status: Former Smoker    Packs/day: 1.00    Types: Cigarettes    Quit date: 08/11/2015  . Smokeless tobacco: Never Used  . Alcohol use 0.0 oz/week     Comment: occas.  . Drug use: No  . Sexual activity: Not on file   Other Topics Concern  . 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   Patient has two children.   Patient is working full-time.   Patient has a college degree.   Patient is right-handed.   Patient drinks 2 cups of coffee daily.  Family History  Problem Relation Age of Onset  . Cancer      colon  . Heart attack      grandmother  . Hypertension Brother   . Hypertension Mother   . Sleep apnea Mother   . Sleep apnea Brother   . Sleep apnea Sister   . Cancer Father     pre cancerous colon    Past Medical History:  Diagnosis Date  . 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 Depression   . Hypertension   . MS (multiple sclerosis) ([place])   . Tachycardia     Past Surgical History:  Procedure Laterality Date  . ACHILLES TENDON SURGERY     Hagland deformity  . COLONOSCOPY W/ POLYPECTOMY    . COLONOSCOPY WITH PROPOFOL N/A 05/01/2015   Procedure: COLONOSCOPY WITH PROPOFOL;  Surgeon: Laurence Spates, MD;  Location: [facility];  Service: Endoscopy;  Laterality: N/A;  . Left ankle surgery  2004    Current Outpatient Prescriptions  Medication Sig Dispense Refill  . lisinopril-hydrochlorothiazide (PRINZIDE,ZESTORETIC) 20-12.5 MG tablet Take 1 tablet by mouth daily. 90 tablet 1  . sertraline (ZOLOFT) 100 MG tablet Take 2 tablets (200 mg total) by mouth daily. 180 tablet 1  . TECFIDERA 240 MG CPDR TAKE 1 CAPSULE TWICE A DAY 180 capsule 3   No current facility-administered medications for this visit.     Allergies as of 09/07/2016  . (No Known Allergies)    Vitals: BP 108/64   Pulse 72   Resp 16   Ht 5\' 4"   (1.626 m)   Wt 226 lb (102.5 kg)   BMI 38.79 kg/m  Last Weight:  Wt Readings from Last 1 Encounters:  09/07/16 226 lb (102.5 kg)   [id number] mass index is 38.79 kg/m.     Last Height:   Ht Readings from Last 1 Encounters:  09/07/16 5\' 4"  (1.626 m)    Physical exam:  General: The patient is awake, alert and appears not in acute distress. The patient is well groomed. Head: Normocephalic, atraumatic. Neck is supple. Mallampati 3. neck circumference:17. Nasal airflow patent  . Retrognathia is not seen. Crowded dental status. invisaline user.  Cardiovascular:  Regular rate and rhythm , without  murmurs or carotid bruit, and without distended neck veins. Respiratory: Lungs are clear to auscultation. Skin:  Without evidence of edema, or rash Trunk: BMI is 39 - very obese. . The patient's posture is erect   Neurologic exam : The patient is awake and alert, oriented to place and time.   Memory subjective impaired  MOCA:No flowsheet data found. MMSE:No flowsheet data found. Attention span & concentration ability appears normal.  Speech is fluent,  without dysarthria, dysphonia or aphasia.  Mood and affect are appropriate.  Cranial nerves: Pupils are equal and briskly reactive to light.  Funduscopic exam without evidence of pallor or edema. Extraocular movements  in vertical and horizontal planes intact and without nystagmus. Visual fields by finger perimetry are intact. Hearing to finger rub intact. Facial sensation intact to fine touch. Facial motor strength is symmetric and tongue and uvula move midline. Shoulder shrug was symmetrical.   Motor exam: Normal tone, muscle bulk and symmetric strength in all extremities.Sensory:  Fine touch, pinprick and vibration were tested in all extremities.  There is numbness in the right hand fingers. Proprioception tested in the upper extremities was normal.Coordination: Rapid alternating movements and  Finger-to-nose maneuver  normal without evidence  of ataxia, dysmetria There is  Tremor in the right hand.. Gait and station: Patient walks without assistive device and is able unassisted to climb up to the exam table. Strength within normal limits.  Stance is stable and normal.  Deep tendon reflexes: in the  upper and lower extremities are symmetric and intact. Babinski maneuver response is downgoing.  The patient was advised of the nature of the diagnosed sleep disorder , the treatment options and risks for general a health and wellness arising from not treating the condition.  I spent more than 35 minutes of face to face time with the patient. Greater than 50% of time was spent in counseling and coordination of care. We have discussed the diagnosis and differential and I answered the patient's questions.     Assessment:  After physical and neurologic examination, review of laboratory studies,  Personal review of imaging studies, reports of other /same  Imaging studies ,  Results of polysomnography/ neurophysiology testing and pre-existing records as far as provided in visit., my assessment is   1) we will continue Tecfidera oral treatment for multiple sclerosis. I will order an MRI of the brain to have a current baseline. 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  2) My goal is to have a revisit within 6-8 weeks for detailed memory testing. I will refill Adderall to help with concentration and focus and alertness. 3) We will discuss some low carb diets- weight loss strategies. 4) obstructive sleep apnea is extremely well controlled on the current setting of 10 cm water CPAP, no changes have to be made, but weight loss would certainly be beneficial for this problem as well. Her body mass index is approaching 40.    Plan:  Treatment plan and additional workup : Rv with NP , MOCA and or MMSE, adderall refill, MRI review .     Asencion Partridge Mitchelle Goerner MD  09/07/2016   CC: Alyssa Bass, [address] [place] [address], [place] [address]

## 2016-09-07 NOTE — Addendum Note (Signed)
Addended by: Larey Seat on: 09/07/2016 09:05 AM   Modules accepted: Orders

## 2016-09-08 ENCOUNTER — Telehealth: Payer: Self-pay

## 2016-09-08 LAB — CBC
HEMATOCRIT: 41.1 % (ref 34.0–46.6)
HEMOGLOBIN: 13.7 g/dL (ref 11.1–15.9)
MCH: 32.6 pg (ref 26.6–33.0)
MCHC: 33.3 g/dL (ref 31.5–35.7)
MCV: 98 fL — ABNORMAL HIGH (ref 79–97)
Platelets: 268 10*3/uL (ref 150–379)
RBC: 4.2 x10E6/uL (ref 3.77–5.28)
RDW: 13.3 % (ref 12.3–15.4)
WBC: 5.5 10*3/uL (ref 3.4–10.8)

## 2016-09-08 LAB — COMPREHENSIVE METABOLIC PANEL
A/G RATIO: 1.8 (ref 1.2–2.2)
ALK PHOS: 54 IU/L (ref 39–117)
ALT: 10 IU/L (ref 0–32)
AST: 10 IU/L (ref 0–40)
Albumin: 4.5 g/dL (ref 3.5–5.5)
BILIRUBIN TOTAL: 0.3 mg/dL (ref 0.0–1.2)
BUN / CREAT RATIO: 18 (ref 9–23)
BUN: 13 mg/dL (ref 6–24)
CHLORIDE: 98 mmol/L (ref 96–106)
CO2: 25 mmol/L (ref 18–29)
Calcium: 9.3 mg/dL (ref 8.7–10.2)
Creatinine, Ser: 0.74 mg/dL (ref 0.57–1.00)
GFR calc non Af Amer: 96 mL/min/{1.73_m2} (ref >59)
GFR, EST AFRICAN AMERICAN: 111 mL/min/{1.73_m2} (ref >59)
GLUCOSE: 115 mg/dL — AB (ref 65–99)
Globulin, Total: 2.5 g/dL (ref 1.5–4.5)
POTASSIUM: 4.7 mmol/L (ref 3.5–5.2)
Sodium: 140 mmol/L (ref 134–144)
TOTAL PROTEIN: 7 g/dL (ref 6.0–8.5)

## 2016-09-08 NOTE — Telephone Encounter (Signed)
I received notification that [facility] will be providing the patient's Tecfidera.  [address] [place] [address] P# [phone] F# [phone]

## 2016-09-14 ENCOUNTER — Telehealth: Payer: Self-pay

## 2016-09-14 NOTE — Telephone Encounter (Signed)
-----   Message from Larey Seat, MD sent at 09/09/2016  4:54 PM EST ----- Non fast glucose is normal - MCV is increased, not iron deficiency but perhaps a mild B 12 deficiency present. Rec OTC Vit B complex for 90 days.

## 2016-09-14 NOTE — Telephone Encounter (Signed)
I spoke to patient and she is aware of results and recommendations. Voiced understanding.

## 2016-10-04 ENCOUNTER — Telehealth: Payer: Self-pay

## 2016-10-04 ENCOUNTER — Encounter: Payer: Self-pay | Admitting: Neurology

## 2016-10-04 ENCOUNTER — Other Ambulatory Visit: Payer: Self-pay | Admitting: Neurology

## 2016-10-04 NOTE — Telephone Encounter (Signed)
Received this mychart message from the pt.   "I don't know if you are having phone issues, but I am unable to get through on your main line. I need a written prescription refill for my Adderrall to pick up. Also, the nurse called with the results of my blood work a few weeks ago and told me that my B12 was low. Is it possible to get a prescription from you for B12 injections so they would be covered by my insurance? "  RX for adderall pended and sent to Dr. Brett Fairy for review.  Dr. Brett Fairy advised pt to take vitamin b complex OTC to help her mild B12 deficiency. I am unsure if Dr. Brett Fairy will recommend b12 shots for this pt. Will send to her for review.

## 2016-10-05 ENCOUNTER — Encounter: Payer: Self-pay | Admitting: Neurology

## 2016-10-05 MED ORDER — AMPHETAMINE-DEXTROAMPHET ER 25 MG PO CP24: 25.0000 mg | ORAL_CAPSULE | ORAL | 0 refills | Status: DC

## 2016-10-05 NOTE — Telephone Encounter (Signed)
Send mychart message

## 2016-11-03 ENCOUNTER — Telehealth: Payer: Self-pay | Admitting: *Deleted

## 2016-11-03 MED ORDER — AMPHETAMINE-DEXTROAMPHET ER 25 MG PO CP24: 25.0000 mg | ORAL_CAPSULE | ORAL | 0 refills | Status: DC

## 2016-11-03 NOTE — Telephone Encounter (Signed)
I spoke to patient and advised that the appt was recommended by Dr. Brett Fairy to have MMSE or MOCA. Patient declined appt at this time. Rx is due. On-call physician to sign at this time.

## 2016-11-07 ENCOUNTER — Ambulatory Visit: Payer: Commercial Managed Care - PPO | Admitting: Nurse Practitioner

## 2016-11-28 DIAGNOSIS — L03115 Cellulitis of right lower limb: Secondary | ICD-10-CM | POA: Diagnosis not present

## 2016-12-07 ENCOUNTER — Other Ambulatory Visit: Payer: Self-pay | Admitting: Neurology

## 2016-12-07 MED ORDER — AMPHETAMINE-DEXTROAMPHET ER 25 MG PO CP24: 25.0000 mg | ORAL_CAPSULE | ORAL | 0 refills | Status: DC

## 2016-12-07 NOTE — Telephone Encounter (Signed)
I called pt and advised her that her adderall RX is ready for pick up at the front desk. Pt verbalized understanding.

## 2016-12-07 NOTE — Addendum Note (Signed)
Addended by: Lester Leroy A on: 12/07/2016 08:22 AM   Modules accepted: Orders

## 2016-12-07 NOTE — Addendum Note (Signed)
Addended by: Larey Seat on: 12/07/2016 09:27 AM   Modules accepted: Orders

## 2016-12-07 NOTE — Telephone Encounter (Signed)
Pt calling for refill of amphetamine-dextroamphetamine (ADDERALL XR) 25 MG 24 hr capsule °

## 2016-12-17 ENCOUNTER — Other Ambulatory Visit: Payer: Self-pay | Admitting: Family Medicine

## 2016-12-19 ENCOUNTER — Ambulatory Visit: Payer: Commercial Managed Care - PPO | Admitting: Family Medicine

## 2017-01-09 ENCOUNTER — Other Ambulatory Visit: Payer: Self-pay | Admitting: Neurology

## 2017-01-09 MED ORDER — AMPHETAMINE-DEXTROAMPHET ER 25 MG PO CP24: 25.0000 mg | ORAL_CAPSULE | ORAL | 0 refills | Status: DC

## 2017-01-09 NOTE — Telephone Encounter (Signed)
Pt has yet to follow up with NP as directed. Do you want to refill adderall? Thanks

## 2017-01-09 NOTE — Telephone Encounter (Signed)
notified pt that RX was ready for her at the front desk for pickup. Pt verbalized understanding

## 2017-01-09 NOTE — Telephone Encounter (Signed)
Patient called office requesting refill for amphetamine-dextroamphetamine (ADDERALL XR) 25 MG 24 hr capsule (requesting generic due to change in insurance copay).

## 2017-01-09 NOTE — Addendum Note (Signed)
Addended by: Darleen Crocker on: 01/09/2017 10:51 AM   Modules accepted: Orders

## 2017-01-26 ENCOUNTER — Other Ambulatory Visit: Payer: Self-pay

## 2017-01-26 NOTE — Telephone Encounter (Signed)
Pt is requesting a med refill of zoloft.  She is due for an appointment. She is scheduled to see on 02/13/17.  She is wanting a 30 day supply. Please advise. -EH/RMA

## 2017-01-27 MED ORDER — SERTRALINE HCL 100 MG PO TABS: 200.0000 mg | ORAL_TABLET | Freq: Every day | ORAL | 0 refills | Status: DC

## 2017-01-31 DIAGNOSIS — D2272 Melanocytic nevi of left lower limb, including hip: Secondary | ICD-10-CM | POA: Diagnosis not present

## 2017-01-31 DIAGNOSIS — D225 Melanocytic nevi of trunk: Secondary | ICD-10-CM | POA: Diagnosis not present

## 2017-01-31 DIAGNOSIS — L821 Other seborrheic keratosis: Secondary | ICD-10-CM | POA: Diagnosis not present

## 2017-02-03 ENCOUNTER — Ambulatory Visit: Payer: Commercial Managed Care - PPO | Admitting: Family Medicine

## 2017-02-08 ENCOUNTER — Telehealth: Payer: Self-pay | Admitting: Neurology

## 2017-02-08 ENCOUNTER — Other Ambulatory Visit: Payer: Self-pay | Admitting: Neurology

## 2017-02-08 MED ORDER — AMPHETAMINE-DEXTROAMPHET ER 25 MG PO CP24: 25.0000 mg | ORAL_CAPSULE | ORAL | 0 refills | Status: DC

## 2017-02-08 NOTE — Telephone Encounter (Signed)
Patient requesting refill of amphetamine-dextroamphetamine (ADDERALL XR) 25 MG 24 hr capsule °

## 2017-02-08 NOTE — Telephone Encounter (Signed)
Medication refilled and will be present at front for pick up

## 2017-02-13 ENCOUNTER — Encounter: Payer: Self-pay | Admitting: Family Medicine

## 2017-02-13 ENCOUNTER — Ambulatory Visit (INDEPENDENT_AMBULATORY_CARE_PROVIDER_SITE_OTHER): Payer: Commercial Managed Care - PPO | Admitting: Family Medicine

## 2017-02-13 VITALS — BP 109/56 | HR 80 | Wt 218.0 lb

## 2017-02-13 DIAGNOSIS — R7301 Impaired fasting glucose: Secondary | ICD-10-CM | POA: Diagnosis not present

## 2017-02-13 DIAGNOSIS — F430 Acute stress reaction: Secondary | ICD-10-CM | POA: Diagnosis not present

## 2017-02-13 DIAGNOSIS — I10 Essential (primary) hypertension: Secondary | ICD-10-CM | POA: Diagnosis not present

## 2017-02-13 LAB — POCT GLYCOSYLATED HEMOGLOBIN (HGB A1C): HEMOGLOBIN A1C: 5.2

## 2017-02-13 MED ORDER — SERTRALINE HCL 100 MG PO TABS
200.0000 mg | ORAL_TABLET | Freq: Every day | ORAL | 3 refills | Status: DC
Start: 2017-02-13 — End: 2017-03-06

## 2017-02-13 NOTE — Progress Notes (Addendum)
Subjective:    Patient ID: Alyssa Bass, female    DOB: 1965-12-19, 51 y.o.   MRN: [id number]  HPI Hypertension- Pt denies chest pain, SOB, dizziness, or heart palpitations.  Taking meds as directed w/o problems.  Denies medication side effects.    Impaired fasting glucose-no increased thirst or urination. No symptoms consistent with hypoglycemia.  Acute stress-Overall she is doing well. Currently on 200 mg daily of sertraline. She is actually been on sertraline from is 21 years since her initial diagnosis of an MS. She tried to take her appendix felt off the medication cold [place] at 1 time but caused significant emotional symptoms that has never tried to come off since.  She is currently seeing Dr. Brett Bass for her MS. She's on Taxotere as well as Adderall XR.   Review of Systems  BP (!) 109/56   Pulse 80   Wt 218 lb (98.9 kg)   BMI 37.42 kg/m     No Known Allergies  Past Medical History:  Diagnosis Date  . 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 Depression   . Hypertension   . MS (multiple sclerosis) ([place])   . Tachycardia     Past Surgical History:  Procedure Laterality Date  . ACHILLES TENDON SURGERY     Hagland deformity  . COLONOSCOPY W/ POLYPECTOMY    . COLONOSCOPY WITH PROPOFOL N/A 05/01/2015   Procedure: COLONOSCOPY WITH PROPOFOL;  Surgeon: Alyssa Spates, MD;  Location: [facility];  Service: Endoscopy;  Laterality: N/A;  . Left ankle surgery  2004    Social History   Social History  . Marital status: Legally Separated    Spouse name: Alyssa Bass  . Number of children: 2  . Years of education: 14   Occupational History  .  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 Smoking status: Former Smoker    Packs/day: 1.00    Types: Cigarettes    Quit date: 08/11/2015  . Smokeless tobacco: Never Used  . Alcohol use 0.0 oz/week     Comment: occas.  . Drug use: No  . Sexual activity: Not on file   Other Topics Concern   . 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   Patient has two children.   Patient is working full-time.   Patient has a college degree.   Patient is right-handed.   Patient drinks 2 cups of coffee daily.          Family History  Problem Relation Age of Onset  . Cancer Unknown        colon  . Heart attack Unknown        grandmother  . Hypertension Brother   . Hypertension Mother   . Sleep apnea Mother   . Sleep apnea Brother   . Sleep apnea Sister   . Cancer Father        pre cancerous colon    Outpatient Encounter Prescriptions as of 02/13/2017  Medication Sig  . amphetamine-dextroamphetamine (ADDERALL XR) 25 MG 24 hr capsule Take 1 capsule by mouth every morning.  . Dimethyl Fumarate (TECFIDERA) 240 MG CPDR Take 1 capsule (240 mg total) by mouth 2 (two) times daily.  [name] Kitchen lisinopril-hydrochlorothiazide (PRINZIDE,ZESTORETIC) 20-12.5 MG tablet Take 1 tablet by mouth daily. Due for follow up visit  . sertraline (ZOLOFT) 100 MG tablet Take 2  tablets (200 mg total) by mouth daily.  . [DISCONTINUED] sertraline (ZOLOFT) 100 MG tablet Take 2 tablets (200 mg total) by mouth daily. Due for follow up visit   No facility-administered encounter medications on file as of 02/13/2017.           Objective:   Physical Exam  Constitutional: She is oriented to person, place, and time. She appears well-developed and well-nourished.  HENT:  Head: Normocephalic and atraumatic.  Cardiovascular: Normal rate, regular rhythm and normal heart sounds.   Pulmonary/Chest: Effort normal and breath sounds normal.  Neurological: She is alert and oriented to person, place, and time.  Skin: Skin is warm and dry.  Psychiatric: She has a normal mood and affect. Her behavior is normal.          Assessment & Plan:  HTN- Well controlled. Continue current regimen. Follow up in  6 months.   IFG - Well controlled. Continue to  monitor. She is doing fantastic.  Lab Results  Component Value Date   HGBA1C 5.2 02/13/2017    Acute stress - Overall doing well. Fact I really think she is probably ready to start weaning the medication. I encouraged her to try going down 150 mg daily or the next couple months to see if she successful in doing this. Often times stopping the medication abruptly can cause withdrawal type symptoms which can reactivate mood change. She said she would be willing to try it. Follow-up in 6 months.

## 2017-02-14 ENCOUNTER — Encounter: Payer: Self-pay | Admitting: Family Medicine

## 2017-02-24 ENCOUNTER — Other Ambulatory Visit: Payer: Self-pay | Admitting: Family Medicine

## 2017-03-06 ENCOUNTER — Other Ambulatory Visit: Payer: Self-pay | Admitting: *Deleted

## 2017-03-06 MED ORDER — SERTRALINE HCL 100 MG PO TABS: 200.0000 mg | ORAL_TABLET | Freq: Every day | ORAL | 0 refills | Status: DC

## 2017-03-10 ENCOUNTER — Other Ambulatory Visit: Payer: Self-pay | Admitting: Neurology

## 2017-03-10 MED ORDER — AMPHETAMINE-DEXTROAMPHET ER 25 MG PO CP24: 25.0000 mg | ORAL_CAPSULE | ORAL | 0 refills | Status: DC

## 2017-03-10 NOTE — Telephone Encounter (Signed)
Pt request refill for amphetamine-dextroamphetamine (ADDERALL XR) 25 MG 24 hr capsule . She is wanting to pick up RX today if possible. She has 1 tablet left. Please call to discuss if she can pick this up. She is aware the clinic closes at noon and is closed on Monday for Labor.

## 2017-03-10 NOTE — Addendum Note (Signed)
Addended by: Hope Pigeon on: 03/10/2017 09:46 AM   Modules accepted: Orders

## 2017-03-10 NOTE — Addendum Note (Signed)
Addended by: Larey Seat on: 03/10/2017 10:13 AM   Modules accepted: Orders

## 2017-04-10 ENCOUNTER — Telehealth: Payer: Self-pay | Admitting: Neurology

## 2017-04-10 NOTE — Telephone Encounter (Signed)
Pt request refill for amphetamine-dextroamphetamine (ADDERALL XR) 25 MG 24 hr capsule

## 2017-04-11 ENCOUNTER — Other Ambulatory Visit: Payer: Self-pay | Admitting: Neurology

## 2017-04-11 MED ORDER — AMPHETAMINE-DEXTROAMPHET ER 25 MG PO CP24: 25.0000 mg | ORAL_CAPSULE | ORAL | 0 refills | Status: DC

## 2017-04-11 NOTE — Telephone Encounter (Signed)
Script will be ready for pick up after lunch

## 2017-05-05 DIAGNOSIS — Z1231 Encounter for screening mammogram for malignant neoplasm of breast: Secondary | ICD-10-CM | POA: Diagnosis not present

## 2017-05-10 DIAGNOSIS — H25043 Posterior subcapsular polar age-related cataract, bilateral: Secondary | ICD-10-CM | POA: Diagnosis not present

## 2017-05-10 DIAGNOSIS — H2513 Age-related nuclear cataract, bilateral: Secondary | ICD-10-CM | POA: Diagnosis not present

## 2017-05-10 DIAGNOSIS — H538 Other visual disturbances: Secondary | ICD-10-CM | POA: Diagnosis not present

## 2017-05-11 ENCOUNTER — Telehealth: Payer: Self-pay | Admitting: Neurology

## 2017-05-11 ENCOUNTER — Other Ambulatory Visit: Payer: Self-pay | Admitting: Neurology

## 2017-05-11 MED ORDER — AMPHETAMINE-DEXTROAMPHET ER 25 MG PO CP24: 25.0000 mg | ORAL_CAPSULE | ORAL | 0 refills | Status: DC

## 2017-05-11 NOTE — Telephone Encounter (Signed)
Called the patient to make her aware that the prescription will be ready and will be up at the front for pick up

## 2017-05-11 NOTE — Telephone Encounter (Signed)
Patient called and requested a refill on medication aderral. Patient is leaving to go out of town today and would like to know if she can get it today. Please call and advise.

## 2017-06-13 ENCOUNTER — Other Ambulatory Visit: Payer: Self-pay | Admitting: Neurology

## 2017-06-13 ENCOUNTER — Telehealth: Payer: Self-pay | Admitting: Neurology

## 2017-06-13 MED ORDER — AMPHETAMINE-DEXTROAMPHET ER 25 MG PO CP24: 25.0000 mg | ORAL_CAPSULE | ORAL | 0 refills | Status: DC

## 2017-06-13 NOTE — Telephone Encounter (Signed)
We will have the medication ready for pick up.

## 2017-06-13 NOTE — Telephone Encounter (Signed)
Pt called request refill for amphetamine-dextroamphetamine (ADDERALL XR) 25 MG 24 hr capsule. Pt said she took last tab today. She is wanting to pick up RX this afternoon

## 2017-06-14 DIAGNOSIS — Z01419 Encounter for gynecological examination (general) (routine) without abnormal findings: Secondary | ICD-10-CM | POA: Diagnosis not present

## 2017-06-14 LAB — HM PAP SMEAR

## 2017-06-27 DIAGNOSIS — N924 Excessive bleeding in the premenopausal period: Secondary | ICD-10-CM | POA: Diagnosis not present

## 2017-06-27 DIAGNOSIS — R87612 Low grade squamous intraepithelial lesion on cytologic smear of cervix (LGSIL): Secondary | ICD-10-CM | POA: Diagnosis not present

## 2017-07-11 ENCOUNTER — Other Ambulatory Visit: Payer: Self-pay | Admitting: Nurse Practitioner

## 2017-07-16 DIAGNOSIS — J069 Acute upper respiratory infection, unspecified: Secondary | ICD-10-CM | POA: Diagnosis not present

## 2017-07-20 ENCOUNTER — Telehealth: Payer: Self-pay | Admitting: Neurology

## 2017-07-20 ENCOUNTER — Other Ambulatory Visit: Payer: Self-pay | Admitting: Neurology

## 2017-07-20 MED ORDER — AMPHETAMINE-DEXTROAMPHET ER 25 MG PO CP24: 25.0000 mg | ORAL_CAPSULE | ORAL | 0 refills | Status: DC

## 2017-07-20 NOTE — Telephone Encounter (Signed)
This medication will be ready for pick up at the front desk for the patient

## 2017-07-20 NOTE — Telephone Encounter (Signed)
Pt request refill for amphetamine-dextroamphetamine (ADDERALL XR) 25 MG 24 hr capsule . She will need to pick up the RX tomorrow, she is aware the clinic closes at noon. She will be out of the medication before Monday. I advised the pt to call on Tuesday for refill request as Dr Dyanne Iha does not see pt's on Fridays.

## 2017-07-26 DIAGNOSIS — R87612 Low grade squamous intraepithelial lesion on cytologic smear of cervix (LGSIL): Secondary | ICD-10-CM | POA: Diagnosis not present

## 2017-07-26 DIAGNOSIS — N924 Excessive bleeding in the premenopausal period: Secondary | ICD-10-CM | POA: Diagnosis not present

## 2017-08-15 ENCOUNTER — Ambulatory Visit: Payer: Self-pay | Admitting: Neurology

## 2017-08-17 ENCOUNTER — Encounter: Payer: Self-pay | Admitting: Family Medicine

## 2017-08-17 ENCOUNTER — Ambulatory Visit (INDEPENDENT_AMBULATORY_CARE_PROVIDER_SITE_OTHER): Payer: Commercial Managed Care - PPO | Admitting: Family Medicine

## 2017-08-17 VITALS — BP 128/68 | HR 97 | Ht 64.0 in | Wt 213.0 lb

## 2017-08-17 DIAGNOSIS — I10 Essential (primary) hypertension: Secondary | ICD-10-CM

## 2017-08-17 DIAGNOSIS — R7301 Impaired fasting glucose: Secondary | ICD-10-CM | POA: Diagnosis not present

## 2017-08-17 DIAGNOSIS — G35 Multiple sclerosis: Secondary | ICD-10-CM

## 2017-08-17 DIAGNOSIS — Z6836 Body mass index (BMI) 36.0-36.9, adult: Secondary | ICD-10-CM

## 2017-08-17 LAB — POCT GLYCOSYLATED HEMOGLOBIN (HGB A1C): HEMOGLOBIN A1C: 5.5

## 2017-08-17 MED ORDER — LISINOPRIL-HYDROCHLOROTHIAZIDE 20-12.5 MG PO TABS: 1.0000 | ORAL_TABLET | Freq: Every day | ORAL | 1 refills | Status: DC

## 2017-08-17 NOTE — Progress Notes (Signed)
Subjective:    CC: BP and glucose   HPI: . Hypertension- Pt denies chest pain, SOB, dizziness, or heart palpitations.  Taking meds as directed w/o problems.  Denies medication side effects.    Impaired fasting glucose-no increased thirst or urination. No symptoms consistent with hypoglycemia.  Weight gain-overall she is doing great.  She recently joined [employer] and has Artie lost a little over 6 pounds.  She is been really sticking to her diet but has not been as physically active but plans to start exercising again.  MS--overall doing well.  Currently on Tecfidera.  She has not had any relapses in her symptoms.  She did have to have a colposcopy recently for abnormal Pap smear.  The biopsy reports came back normal but she was started on some extra folic acid.  Past medical history, Surgical history, Family history not pertinant except as noted below, Social history, Allergies, and medications have been entered into the medical record, reviewed, and corrections made.   Review of Systems: No fevers, chills, night sweats, weight loss, chest pain, or shortness of breath.   Objective:    General: Well Developed, well nourished, and in no acute distress.  Neuro: Alert and oriented x3, extra-ocular muscles intact, sensation grossly intact.  HEENT: Normocephalic, atraumatic  Skin: Warm and dry, no rashes. Cardiac: Regular rate and rhythm, no murmurs rubs or gallops, no lower extremity edema.  Respiratory: Clear to auscultation bilaterally. Not using accessory muscles, speaking in full sentences.   Impression and Recommendations:    IFG - Well controlled. Continue current regimen. Follow up in  a1c of 5.5   HTN - Well controlled. Continue current regimen. Follow up in  6 months.      Due for fasting lipid panel.  BMI 36 -job on weight loss so far.  Continue with weight watchers and start to add in exercise and activity.  We discussed some strategies around  that.  MS-stable-following with Dr. Brett Fairy at neurology.

## 2017-08-22 ENCOUNTER — Ambulatory Visit: Payer: Commercial Managed Care - PPO | Admitting: Neurology

## 2017-08-22 ENCOUNTER — Encounter: Payer: Self-pay | Admitting: Neurology

## 2017-08-22 VITALS — BP 122/82 | HR 90 | Ht 64.0 in | Wt 214.0 lb

## 2017-08-22 DIAGNOSIS — G4733 Obstructive sleep apnea (adult) (pediatric): Secondary | ICD-10-CM | POA: Diagnosis not present

## 2017-08-22 DIAGNOSIS — G35 Multiple sclerosis: Secondary | ICD-10-CM

## 2017-08-22 DIAGNOSIS — G4719 Other hypersomnia: Secondary | ICD-10-CM

## 2017-08-22 DIAGNOSIS — Z5181 Encounter for therapeutic drug level monitoring: Secondary | ICD-10-CM | POA: Diagnosis not present

## 2017-08-22 MED ORDER — AMPHETAMINE-DEXTROAMPHET ER 25 MG PO CP24: 25.0000 mg | ORAL_CAPSULE | ORAL | 0 refills | Status: DC

## 2017-08-22 MED ORDER — AMPHETAMINE-DEXTROAMPHET ER 30 MG PO CP24: 30.0000 mg | ORAL_CAPSULE | Freq: Every day | ORAL | 0 refills | Status: DC

## 2017-08-22 NOTE — Progress Notes (Signed)
SLEEP MEDICINE CLINIC   Provider:  Larey Seat, M D  Referring Provider:  Primary Care Physician:  Hali Marry, MD  Chief Complaint  Patient presents with  . Follow-up    pt alone, rm 10. pt states that CPAP is working well and gets her supplies through [facility]. pt forgot her card to download. pt states that everything else is going well.     HPI:  Alyssa Bass is a 52 y.o. female , seen here as a revisit  from Dr. Madilyn Fireman for MS and OSA follow up,  Patient uses a CPAP machine that is about 50 years old, cannot be interrogated by Wi-Fi. Do not have compliance data for her CPAP today but she states that her CPAP therapy is going well, but she has been using the same settings and supplies.  There has been no need to change interfaces, humidity settings or otherwise.  If she needs a new machine she would definitely be allowed to get one. Patient was presenting  in 1997 with symptoms of optic neuritis which by the year 2000 for joint by symptoms of sensory abnormalities , and she was finally diagnosed with multiple sclerosis.  Spinal test revealed in 2000 that oligoclonal bands were present and establish the diagnosis. She was followed for a while by Dr. Morene Antu, and  by [facility] transiently seen Dr. 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 She has been followed in our office again since 2015. In our last visit we discussed repeating an MRI but her co-pay was very high and she decided to wait.  She would like of course to know if the colloid cyst has grown and also if there are new lesions.  She had clinically no evidence of an MS relapse on Tecfidera. She was last seen in 2015- she has been using adderall for daytime sleepiness.  She has reported delayed memory- names , words, but she is not getting lost.  She performed today excellently on a Montreal cognitive assessment test 30 out of 30 points    HISTORY OF PRESENT ILLNESS:   (CD): after delivering a  healthy baby daughter. A spinal tap had revealed only one band, but she developed optic neuritis in 2000, when she developed sensory symptoms. A repeat CSF test revealed than in 2000 the oligoclonal bands and established the diagnosis.  Alyssa Bass, a former patient of Dr. Morene Antu, has been followed by him for her multiple sclerosis- but she also carries a diagnosis of an AVM and intra-ventricular cyst.  To screen for progression in both diseases she has undergone regular MRIs. She is also followed at [facility] where she was seen, from where the more recent imaging studies are available . I have a copy here of her last MRI from 03/2011 showing that the patient has multiple foci of periventricular and deep white matter lesions but predominantly perivascular. Her white matter disease has gradually progressed since the Baseline image and examination in 2002.  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 There was also no significant change in the left frontal venous angioma. She has had elevated LFTs in the past, and a positive JC Virus antibody screen.   05/12/14 (LL): Alyssa Bass returns for 6 month revisit, she has been doing well on Tecfidera, no relapses or new symptoms since last visit. Has flushing as  her only side effect which she states is random times. She continues to feel that her memory is not what it once was and has occasional positional dizziness, but does not have it at this time. She continues to have mild numbness in her right fingers, this has been long-standing. She feels much better since switching to Adderall XR taking one a day because she often forget the second dose of the immediate release, when she remembered it was too late in the day to take it.   I reviewed the patient's CPAP compliance data from 02/11/14 to 05/11/14, which is a total of 90 days, during which time the patient used  CPAP every day except for 1 day. The average usage for all days was 8 hours and 2 minutes. The percent used days greater than 4 hours was 98%, indicating excellent compliance. The residual AHI was 0.2 per hour, indicating an good treatment pressure of 10 cwp with EPR of 3. Air leak from the mask was median 0.8. She is using the nasal pillow, but has a concern with the mask strap causing hair loss behind the temples. She endorses a huge improvement in her sleep using he cpap.  01-03-14 CD  I see Alyssa Bass today for a follow up on fatigue and excessive daytime sleepiness.  The patient is on CPAP for OSA and was followed by a [place] sleep clinic, Dr. Mickel Fuchs saw her. The dependence got closed.  She was diagnosed based on a overall AHI of only 7.8 and REM dependent AHi 22.4 with desaturations in oxygen.  CPAP has been used at 10 cm water since, with a nasal mask. She doesn't like the straps on her head.  She would be delighted to get a new headset - and was happy to hear her download and compliance reviewed as high.  The download encompasses 453 days since the last download !. The patient has a 96% compliance, she was daily by time is 7 hours and 46 minutes and the residual AHI is only 0.2.  This is an almost complete resolution of apnea with one of the highest compliance is. Set pressures 10 cm to 3 cm EPR. She still has a moderate air leak.  I would like for her to try a nasal pillow .   11/08/13 (LL): Alyssa Bass returns for followup, she has been on Tecfidera for about 4 months and loves it.  Interval history from 09/07/2016, I have pleasure of seeing Alyssa Bass after a 2 year hiatus. She has been followed by our nurse practitioners is a compliant CPAP user and here today for a compliance visit as well is discussing her MS status, she continues to be very happy with tecfidera treatments and has only occasionally flushing. The patient's download from her CPAP today reveals a residual  apnea index of only 0.697% compliance for days and 90% compliance for over 4 hours of consecutive use, with average user time of 6 hours and 54 minutes. CPAP is set at 10 cm water with 3 cm expiratory pressure relief. Epworth sleepiness score was endorsed at 6 points fatigue severity score at 32 points. The patient reports that her divorce with stressful understandably, that her blood pressure and heart rate went up during times of legal fights, she paused her Adderall medication. She now has found her new baseline, continues to work productively and feels that she needs a refill on Adderall. Interestingly, she reports that Adderall increases her appetite. Her last MRI was  2-3 years  ago., she reports memory decline.      Review of Systems: Out of a complete 14 system review, the patient complains of only the following symptoms, and all other reviewed systems are negative.  Memory loss,  Weight gain, HTN, inattentiveness, unable to focus.   Social History   Socioeconomic History  . Marital status: Single    Spouse name: Elta Guadeloupe  . Number of children: 2  . Years of education: 38  . Highest education level: Not on file  Social Needs  . Financial resource strain: Not on file  . Food insecurity - worry: Not on file  . Food insecurity - inability: Not on file  . Transportation needs - medical: Not on file  . Transportation needs - non-medical: Not on file  Occupational History    Employer: [employer]    Comment: Statistician  Tobacco Use  . Smoking status: Former Smoker    Packs/day: 1.00    Types: Cigarettes    Last attempt to quit: 08/11/2015    Years since quitting: 2.0  . Smokeless tobacco: Never Used  Substance and Sexual Activity  . Alcohol use: Yes    Alcohol/week: 0.0 oz    Comment: occas.  . Drug use: No  . Sexual activity: Not on file  Other Topics Concern  . Not on file  Social History Narrative   Patient is married Public relations account executive) and lives at home with her  husband and children.   Patient has two children.   Patient is working full-time.   Patient has a college degree.   Patient is right-handed.   Patient drinks 2 cups of coffee daily.       Family History  Problem Relation Age of Onset  . Cancer Unknown        colon  . Heart attack Unknown        grandmother  . Hypertension Brother   . Hypertension Mother   . Sleep apnea Mother   . Sleep apnea Brother   . Sleep apnea Sister   . Cancer Father        pre cancerous colon    Past Medical History:  Diagnosis Date  . 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 Depression   . Hypertension   . MS (multiple sclerosis) ([place])   . Tachycardia     Past Surgical History:  Procedure Laterality Date  . ACHILLES TENDON SURGERY     Hagland deformity  . COLONOSCOPY W/ POLYPECTOMY    . COLONOSCOPY WITH PROPOFOL N/A 05/01/2015   Procedure: COLONOSCOPY WITH PROPOFOL;  Surgeon: Laurence Spates, MD;  Location: [facility];  Service: Endoscopy;  Laterality: N/A;  . Left ankle surgery  2004    Current Outpatient Medications  Medication Sig Dispense Refill  . amphetamine-dextroamphetamine (ADDERALL XR) 25 MG 24 hr capsule Take 1 capsule by mouth every morning. 30 capsule 0  . folic acid (FOLVITE) 1 MG tablet Take 1 mg by mouth daily.  4  . lisinopril-hydrochlorothiazide (PRINZIDE,ZESTORETIC) 20-12.5 MG tablet Take 1 tablet by mouth daily. Due for follow up visit 90 tablet 1  . sertraline (ZOLOFT) 100 MG tablet Take 2 tablets (200 mg total) by mouth daily. 60 tablet 0  . TECFIDERA 240 MG CPDR TAKE 1 CAPSULE (240 MG) TWICE A DAY 180 capsule 0   No current facility-administered medications for this visit.     Allergies as of 08/22/2017  . (No Known Allergies)    Vitals: BP 122/82  Pulse 90   Ht 5\' 4"  (1.626 m)   Wt 214 lb (97.1 kg)   BMI 36.73 kg/m  Last Weight:  Wt Readings from Last 1 Encounters:  08/22/17 214 lb (97.1 kg)   VQM:GQQP mass index is 36.73 kg/m.     Last  Height:   Ht Readings from Last 1 Encounters:  08/22/17 5\' 4"  (1.626 m)    Physical exam:  General: The patient is awake, alert and appears not in acute distress. The patient is well groomed. Head: Normocephalic, atraumatic. Neck is supple. Mallampati 3. neck circumference:17. Nasal airflow patent  . Retrognathia is not seen. Crowded dental status. invisaline user.  Cardiovascular:  Regular rate and rhythm , without  murmurs or carotid bruit, and without distended neck veins. Respiratory: Lungs are clear to auscultation. Skin:  Without evidence of edema, or rash Trunk: BMI is 39 - very obese. . The patient's posture is erect   Neurologic exam : The patient is awake and alert, oriented to place and time.   Memory subjective impaired  MOCA: Montreal Cognitive Assessment  08/22/2017  Visuospatial/ Executive (0/5) 5  Naming (0/3) 3  Attention: Read list of digits (0/2) 2  Attention: Read list of letters (0/1) 1  Attention: Serial 7 subtraction starting at 100 (0/3) 3  Language: Repeat phrase (0/2) 2  Language : Fluency (0/1) 1  Abstraction (0/2) 2  Delayed Recall (0/5) 5  Orientation (0/6) 6  Total 30   MMSE:No flowsheet data found. Attention span & concentration ability appears normal.  Speech is fluent,  without dysarthria, dysphonia or aphasia.  Mood and affect are appropriate, pleasant, cooperative..  Cranial nerves: Pupils are equal and briskly reactive to light.  Funduscopic exam without evidence of pallor or edema. Extraocular movements  in vertical and horizontal planes intact and without nystagmus.  Facial sensation intact to fine touch. Facial motor strength is symmetric and tongue and uvula move midline. Shoulder shrug was symmetrical.   Motor exam: Normal tone, muscle bulk and symmetric strength in all extremities.Sensory:  Fine touch, pinprick and vibration were tested in all extremities.  There is numbness in the right hand fingers. Proprioception tested in the upper  extremities was normal.Coordination: Rapid alternating movements and  Finger-to-nose maneuver  normal without evidence of ataxia, dysmetria There is  Tremor in the right hand.. Gait and station: Patient walks without assistive device and is able unassisted to climb up to the exam table. Strength within normal limits.  Stance is stable and normal.  Deep tendon reflexes: in the  upper and lower extremities are symmetric and intact. Babinski maneuver response is downgoing.  The patient was advised of the nature of the diagnosed sleep disorder OSA , the treatment options and risks for general a health and wellness arising from not treating the condition.  She has some early cataracts. She has not had MS related  brainMRI in a while-  remains on Tecfidera.   I spent more than 35 minutes of face to face time with the patient. Greater than 50% of time was spent in counseling and coordination of care. We have discussed the diagnosis and differential and I answered the patient's questions.     Assessment:  After physical and neurologic examination, review of laboratory studies,  Personal review of imaging studies, reports of other /same  Imaging studies ,  Results of polysomnography/ neurophysiology testing and pre-existing records as far as provided in visit., my assessment is   1) we will continue Tecfidera oral treatment  for multiple sclerosis. I will order an MRI of the brain to have a current baseline. 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   2) My goal is to have a revisit within 6 month for detailed memory testing.  I will refill Adderall to help with concentration and focus and alertness.  3) obstructive sleep apnea is extremely well controlled on the current setting of 10 cm water CPAP, no changes have to be made, but weight loss would certainly be beneficial for this problem as well. Her body mass index is approaching 40.    Plan:  Treatment plan and additional  workup : Rv with NP in 6 month refill adderall XR at 30 mg, MOCA . Adderall refill,  MRI repeat      Larey Seat MD  08/22/2017   CC: Hali Marry, [address] [place],  [id number]

## 2017-08-23 ENCOUNTER — Encounter: Payer: Self-pay | Admitting: Family Medicine

## 2017-08-23 LAB — LIPID PANEL
CHOL/HDL RATIO: 2.7 ratio (ref 0.0–4.4)
Cholesterol, Total: 187 mg/dL (ref 100–199)
HDL: 70 mg/dL (ref >39)
LDL Calculated: 100 mg/dL — ABNORMAL HIGH (ref 0–99)
Triglycerides: 83 mg/dL (ref 0–149)
VLDL Cholesterol Cal: 17 mg/dL (ref 5–40)

## 2017-08-23 LAB — CBC WITH DIFFERENTIAL/PLATELET
BASOS ABS: 0 10*3/uL (ref 0.0–0.2)
Basos: 1 %
EOS (ABSOLUTE): 0.1 10*3/uL (ref 0.0–0.4)
EOS: 2 %
HEMATOCRIT: 39.9 % (ref 34.0–46.6)
HEMOGLOBIN: 13.5 g/dL (ref 11.1–15.9)
IMMATURE GRANULOCYTES: 0 %
Immature Grans (Abs): 0 10*3/uL (ref 0.0–0.1)
LYMPHS: 20 %
Lymphocytes Absolute: 1.1 10*3/uL (ref 0.7–3.1)
MCH: 32.8 pg (ref 26.6–33.0)
MCHC: 33.8 g/dL (ref 31.5–35.7)
MCV: 97 fL (ref 79–97)
MONOCYTES: 9 %
Monocytes Absolute: 0.5 10*3/uL (ref 0.1–0.9)
NEUTROS PCT: 68 %
Neutrophils Absolute: 3.7 10*3/uL (ref 1.4–7.0)
Platelets: 268 10*3/uL (ref 150–379)
RBC: 4.12 x10E6/uL (ref 3.77–5.28)
RDW: 13.2 % (ref 12.3–15.4)
WBC: 5.4 10*3/uL (ref 3.4–10.8)

## 2017-08-23 LAB — COMPREHENSIVE METABOLIC PANEL
A/G RATIO: 1.8 (ref 1.2–2.2)
ALT: 12 IU/L (ref 0–32)
AST: 13 IU/L (ref 0–40)
Albumin: 4.6 g/dL (ref 3.5–5.5)
Alkaline Phosphatase: 60 IU/L (ref 39–117)
BILIRUBIN TOTAL: 0.2 mg/dL (ref 0.0–1.2)
BUN / CREAT RATIO: 27 — AB (ref 9–23)
BUN: 18 mg/dL (ref 6–24)
CHLORIDE: 100 mmol/L (ref 96–106)
CO2: 22 mmol/L (ref 20–29)
Calcium: 9.4 mg/dL (ref 8.7–10.2)
Creatinine, Ser: 0.66 mg/dL (ref 0.57–1.00)
GFR, EST AFRICAN AMERICAN: 120 mL/min/{1.73_m2} (ref >59)
GFR, EST NON AFRICAN AMERICAN: 104 mL/min/{1.73_m2} (ref >59)
GLOBULIN, TOTAL: 2.6 g/dL (ref 1.5–4.5)
Glucose: 113 mg/dL — ABNORMAL HIGH (ref 65–99)
Potassium: 4.5 mmol/L (ref 3.5–5.2)
SODIUM: 139 mmol/L (ref 134–144)
TOTAL PROTEIN: 7.2 g/dL (ref 6.0–8.5)

## 2017-08-24 ENCOUNTER — Telehealth: Payer: Self-pay | Admitting: Neurology

## 2017-08-24 NOTE — Telephone Encounter (Signed)
-----   Message from Larey Seat, MD sent at 08/23/2017  5:08 PM EST ----- This is an expected normal metabolic panel for the patient.  The higher BUN number indicates that she is dehydrated.  Glucose is elevated as these tests were not obtained on at fast.

## 2017-08-24 NOTE — Telephone Encounter (Signed)
Called and went over the lab work with the pt. Pt verbalized understanding. Pt had no questions at this time but was encouraged to call back if questions arise.

## 2017-10-05 ENCOUNTER — Other Ambulatory Visit: Payer: Self-pay | Admitting: Nurse Practitioner

## 2017-10-19 ENCOUNTER — Other Ambulatory Visit: Payer: Self-pay | Admitting: *Deleted

## 2017-10-19 MED ORDER — OLOPATADINE HCL 0.2 % OP SOLN: 1.0000 [drp] | Freq: Every day | OPHTHALMIC | 3 refills | Status: DC

## 2017-10-30 ENCOUNTER — Telehealth: Payer: Self-pay | Admitting: Neurology

## 2017-10-30 ENCOUNTER — Other Ambulatory Visit: Payer: Self-pay | Admitting: Neurology

## 2017-10-30 NOTE — Telephone Encounter (Signed)
I have routed to the work in MD to fill in Dr Dohmeier's absence.

## 2017-10-30 NOTE — Telephone Encounter (Signed)
Pt request refill for amphetamine-dextroamphetamine (ADDERALL XR) 30 MG 24 hr capsule [facility]

## 2017-10-30 NOTE — Telephone Encounter (Signed)
Okay to refill 1 

## 2017-10-31 ENCOUNTER — Other Ambulatory Visit: Payer: Self-pay | Admitting: Neurology

## 2017-10-31 MED ORDER — AMPHETAMINE-DEXTROAMPHET ER 30 MG PO CP24: 30.0000 mg | ORAL_CAPSULE | Freq: Every day | ORAL | 0 refills | Status: DC

## 2017-11-22 ENCOUNTER — Telehealth: Payer: Self-pay | Admitting: Neurology

## 2017-11-22 ENCOUNTER — Other Ambulatory Visit: Payer: Self-pay | Admitting: Neurology

## 2017-11-22 NOTE — Telephone Encounter (Signed)
Pt has called for a refill of her  amphetamine-dextroamphetamine (ADDERALL XR) 30 MG 24 hr capsule Please send [facility]  [id number] [place] [place] [phone] (Phone) [phone] (Fax)

## 2017-11-22 NOTE — Telephone Encounter (Signed)
[facility] [address] We cant fill before 2 days. I will send the refill request in for the patient on 11/28/17.

## 2017-11-24 NOTE — Telephone Encounter (Signed)
Pt called stating she is out of the medication. I informed her that per our records I sent the order in on 10/31/17 at almost 9 am. It was completed by Dr Felecia Shelling in Dr Dohmeier's absence. I informed her that she can check with the pharmacy to see if its on file with them, if not then they can contact me with last date it was filled. Otherwise earliest it can be completed is 5/21. Pt verbalized understanding.

## 2017-11-27 ENCOUNTER — Other Ambulatory Visit: Payer: Self-pay | Admitting: Neurology

## 2017-11-27 MED ORDER — AMPHETAMINE-DEXTROAMPHET ER 30 MG PO CP24: 30.0000 mg | ORAL_CAPSULE | Freq: Every day | ORAL | 0 refills | Status: DC

## 2017-11-27 NOTE — Telephone Encounter (Signed)
I have sent the request to Dr Brett Fairy for review. Once she has reviewed she will send to the pharmacy for the pt.

## 2017-12-19 DIAGNOSIS — L821 Other seborrheic keratosis: Secondary | ICD-10-CM | POA: Diagnosis not present

## 2017-12-19 DIAGNOSIS — D225 Melanocytic nevi of trunk: Secondary | ICD-10-CM | POA: Diagnosis not present

## 2017-12-19 DIAGNOSIS — D2272 Melanocytic nevi of left lower limb, including hip: Secondary | ICD-10-CM | POA: Diagnosis not present

## 2018-01-01 ENCOUNTER — Other Ambulatory Visit: Payer: Self-pay | Admitting: Neurology

## 2018-01-01 MED ORDER — AMPHETAMINE-DEXTROAMPHET ER 30 MG PO CP24: 30.0000 mg | ORAL_CAPSULE | Freq: Every day | ORAL | 0 refills | Status: DC

## 2018-01-01 NOTE — Telephone Encounter (Signed)
Pt has called for a refill on her amphetamine-dextroamphetamine (ADDERALL XR) 30 MG 24 hr capsule Please send to  [id number] [place], [place] [phone] (Phone) [phone] (Fax)

## 2018-01-01 NOTE — Addendum Note (Signed)
Addended by: France Ravens I on: 01/01/2018 01:51 PM   Modules accepted: Orders

## 2018-01-30 ENCOUNTER — Telehealth: Payer: Self-pay | Admitting: Neurology

## 2018-01-30 ENCOUNTER — Other Ambulatory Visit: Payer: Self-pay | Admitting: Neurology

## 2018-01-30 MED ORDER — AMPHETAMINE-DEXTROAMPHET ER 30 MG PO CP24: 30.0000 mg | ORAL_CAPSULE | Freq: Every day | ORAL | 0 refills | Status: DC

## 2018-01-30 NOTE — Telephone Encounter (Signed)
Pt has called for a refill on her amphetamine-dextroamphetamine (ADDERALL XR) 30 MG 24 hr capsule, pt has been scheduled for her 6 mo f/u 07-31-2018 Please [facility]  [id number] [place], [place] [phone] (Phone) [phone] (Fax)

## 2018-01-30 NOTE — Telephone Encounter (Signed)
I have routed this request to Dr Dohmeier for review. The pt is due for the medication and [place] registry was verified.  

## 2018-02-11 ENCOUNTER — Other Ambulatory Visit: Payer: Self-pay | Admitting: Family Medicine

## 2018-02-13 ENCOUNTER — Other Ambulatory Visit: Payer: Self-pay | Admitting: Family Medicine

## 2018-02-13 DIAGNOSIS — I10 Essential (primary) hypertension: Secondary | ICD-10-CM

## 2018-02-13 NOTE — Telephone Encounter (Signed)
Called pt and lvm advising her that she will need to schedule an appt for refill of this medication. Advised that she was to f/u this month w/Dr. Madilyn Fireman and that per last refill of this medication it was advised that she schedule an appt.Alyssa Bass, [name]

## 2018-03-05 ENCOUNTER — Telehealth: Payer: Self-pay | Admitting: Neurology

## 2018-03-05 ENCOUNTER — Other Ambulatory Visit: Payer: Self-pay | Admitting: Neurology

## 2018-03-05 MED ORDER — AMPHETAMINE-DEXTROAMPHET ER 30 MG PO CP24: 30.0000 mg | ORAL_CAPSULE | Freq: Every day | ORAL | 0 refills | Status: DC

## 2018-03-05 NOTE — Telephone Encounter (Signed)
Patient requesting refill of amphetamine-dextroamphetamine (ADDERALL XR) 30 MG 24 hr capsule [facility] [address]

## 2018-03-05 NOTE — Telephone Encounter (Signed)
I have routed this request to Dr Dohmeier for review. The pt is due for the medication and [place] registry was verified.  

## 2018-03-20 ENCOUNTER — Other Ambulatory Visit: Payer: Self-pay

## 2018-03-20 MED ORDER — SERTRALINE HCL 100 MG PO TABS: 200.0000 mg | ORAL_TABLET | Freq: Every day | ORAL | 0 refills | Status: DC

## 2018-04-04 ENCOUNTER — Telehealth: Payer: Self-pay | Admitting: Neurology

## 2018-04-04 ENCOUNTER — Other Ambulatory Visit: Payer: Self-pay | Admitting: Neurology

## 2018-04-04 MED ORDER — AMPHETAMINE-DEXTROAMPHET ER 30 MG PO CP24: 30.0000 mg | ORAL_CAPSULE | Freq: Every day | ORAL | 0 refills | Status: DC

## 2018-04-04 NOTE — Telephone Encounter (Signed)
I have routed this request to Dr Dohmeier for review. The pt is due for the medication and [place] registry was verified.  

## 2018-04-04 NOTE — Telephone Encounter (Signed)
Pt requesting refills for amphetamine-dextroamphetamine (ADDERALL XR) 30 MG [facility]

## 2018-04-05 DIAGNOSIS — H25043 Posterior subcapsular polar age-related cataract, bilateral: Secondary | ICD-10-CM | POA: Diagnosis not present

## 2018-04-05 DIAGNOSIS — H524 Presbyopia: Secondary | ICD-10-CM | POA: Diagnosis not present

## 2018-04-05 DIAGNOSIS — H2513 Age-related nuclear cataract, bilateral: Secondary | ICD-10-CM | POA: Diagnosis not present

## 2018-04-05 DIAGNOSIS — H5213 Myopia, bilateral: Secondary | ICD-10-CM | POA: Diagnosis not present

## 2018-04-16 ENCOUNTER — Encounter: Payer: Self-pay | Admitting: Family Medicine

## 2018-04-16 ENCOUNTER — Ambulatory Visit: Payer: Commercial Managed Care - PPO | Admitting: Family Medicine

## 2018-04-16 VITALS — BP 136/71 | HR 65 | Ht 64.0 in | Wt 212.0 lb

## 2018-04-16 DIAGNOSIS — R7301 Impaired fasting glucose: Secondary | ICD-10-CM

## 2018-04-16 DIAGNOSIS — I10 Essential (primary) hypertension: Secondary | ICD-10-CM | POA: Diagnosis not present

## 2018-04-16 DIAGNOSIS — G4733 Obstructive sleep apnea (adult) (pediatric): Secondary | ICD-10-CM

## 2018-04-16 DIAGNOSIS — G35 Multiple sclerosis: Secondary | ICD-10-CM | POA: Diagnosis not present

## 2018-04-16 LAB — BASIC METABOLIC PANEL WITH GFR
BUN: 14 mg/dL (ref 7–25)
CHLORIDE: 103 mmol/L (ref 98–110)
CO2: 27 mmol/L (ref 20–32)
Calcium: 9.4 mg/dL (ref 8.6–10.4)
Creat: 0.58 mg/dL (ref 0.50–1.05)
GFR, Est African American: 125 mL/min/{1.73_m2} (ref >=60)
GFR, Est Non African American: 107 mL/min/{1.73_m2} (ref >=60)
GLUCOSE: 108 mg/dL (ref 65–139)
POTASSIUM: 4.4 mmol/L (ref 3.5–5.3)
SODIUM: 139 mmol/L (ref 135–146)

## 2018-04-16 LAB — POCT GLYCOSYLATED HEMOGLOBIN (HGB A1C): HEMOGLOBIN A1C: 5.5 % (ref 4.0–5.6)

## 2018-04-16 MED ORDER — SERTRALINE HCL 100 MG PO TABS: 200.0000 mg | ORAL_TABLET | Freq: Every day | ORAL | 1 refills | Status: DC

## 2018-04-16 NOTE — Progress Notes (Signed)
Subjective:    CC: BP  HPI:  Hypertension- Pt denies chest pain, SOB, dizziness, or heart palpitations.  Taking meds as directed w/o problems.  Denies medication side effects.    Impaired fasting glucose-no increased thirst or urination. No symptoms consistent with hypoglycemia.  F/U OSA -doing well on her CPAP machine without any problems.  She is using it consistently and feels like it is effective.  MS-she is still currently on Tecfidera and then using Adderall for fatigue and focus.  Past medical history, Surgical history, Family history not pertinant except as noted below, Social history, Allergies, and medications have been entered into the medical record, reviewed, and corrections made.   Review of Systems: No fevers, chills, night sweats, weight loss, chest pain, or shortness of breath.   Objective:    General: Well Developed, well nourished, and in no acute distress.  Neuro: Alert and oriented x3, extra-ocular muscles intact, sensation grossly intact.  HEENT: Normocephalic, atraumatic  Skin: Warm and dry, no rashes. Cardiac: Regular rate and rhythm, no murmurs rubs or gallops, no lower extremity edema.  Respiratory: Clear to auscultation bilaterally. Not using accessory muscles, speaking in full sentences.   Impression and Recommendations:    HTN -well-controlled overall the little bit higher today than it typically is.  IFG -well-controlled overall.  Follow-up in 6 months.  OSA -stable on CPAP.  MS -along with Dr. Brett Fairy.  Currently on Tecfidera.

## 2018-04-17 NOTE — Progress Notes (Signed)
All labs are normal. 

## 2018-05-09 DIAGNOSIS — H2512 Age-related nuclear cataract, left eye: Secondary | ICD-10-CM | POA: Diagnosis not present

## 2018-05-09 DIAGNOSIS — H25041 Posterior subcapsular polar age-related cataract, right eye: Secondary | ICD-10-CM | POA: Diagnosis not present

## 2018-05-09 DIAGNOSIS — H2511 Age-related nuclear cataract, right eye: Secondary | ICD-10-CM | POA: Diagnosis not present

## 2018-05-09 DIAGNOSIS — H25042 Posterior subcapsular polar age-related cataract, left eye: Secondary | ICD-10-CM | POA: Diagnosis not present

## 2018-05-10 ENCOUNTER — Other Ambulatory Visit: Payer: Self-pay | Admitting: Neurology

## 2018-05-10 NOTE — Telephone Encounter (Signed)
Pt requesting refills for amphetamine-dextroamphetamine (ADDERALL XR) 30 MG [facility]

## 2018-05-11 NOTE — Telephone Encounter (Signed)
Unable to check [place] drug registry; web site not working. Another RN tried as well. Called patient to inform her. She stated she has enough Adderall to get her through the weekend. This RN advised will try to get refill done on Monday. She verbalized understanding, appreciation.

## 2018-05-14 MED ORDER — AMPHETAMINE-DEXTROAMPHET ER 30 MG PO CP24: 30.0000 mg | ORAL_CAPSULE | Freq: Every day | ORAL | 0 refills | Status: DC

## 2018-05-14 NOTE — Telephone Encounter (Signed)
Checked [place] drug registry. Last refilled 04/04/18. Sent to Dr Brett Fairy to approve.

## 2018-05-14 NOTE — Addendum Note (Signed)
Addended by: Florian Buff C on: 05/14/2018 04:01 PM   Modules accepted: Orders

## 2018-06-04 DIAGNOSIS — K640 First degree hemorrhoids: Secondary | ICD-10-CM | POA: Diagnosis not present

## 2018-06-04 DIAGNOSIS — Z86010 Personal history of colonic polyps: Secondary | ICD-10-CM | POA: Diagnosis not present

## 2018-06-04 LAB — HM COLONOSCOPY

## 2018-06-18 ENCOUNTER — Telehealth: Payer: Self-pay | Admitting: Neurology

## 2018-06-18 ENCOUNTER — Other Ambulatory Visit: Payer: Self-pay | Admitting: Neurology

## 2018-06-18 NOTE — Telephone Encounter (Signed)
I have routed this request to Dr Dohmeier for review. The pt is due for the medication and [place] registry was verified.  

## 2018-06-18 NOTE — Telephone Encounter (Signed)
Pt requesting refill for amphetamine-dextroamphetamine (ADDERALL XR) 30 MG [facility]

## 2018-06-19 ENCOUNTER — Other Ambulatory Visit: Payer: Self-pay | Admitting: Neurology

## 2018-06-19 MED ORDER — AMPHETAMINE-DEXTROAMPHET ER 30 MG PO CP24: 30.0000 mg | ORAL_CAPSULE | Freq: Every day | ORAL | 0 refills | Status: DC

## 2018-06-20 DIAGNOSIS — H2512 Age-related nuclear cataract, left eye: Secondary | ICD-10-CM | POA: Diagnosis not present

## 2018-06-20 DIAGNOSIS — H25042 Posterior subcapsular polar age-related cataract, left eye: Secondary | ICD-10-CM | POA: Diagnosis not present

## 2018-06-28 ENCOUNTER — Encounter: Payer: Self-pay | Admitting: Family Medicine

## 2018-07-24 ENCOUNTER — Telehealth: Payer: Self-pay | Admitting: Neurology

## 2018-07-24 ENCOUNTER — Other Ambulatory Visit: Payer: Self-pay | Admitting: Neurology

## 2018-07-24 MED ORDER — AMPHETAMINE-DEXTROAMPHET ER 30 MG PO CP24: 30.0000 mg | ORAL_CAPSULE | Freq: Every day | ORAL | 0 refills | Status: DC

## 2018-07-24 NOTE — Telephone Encounter (Signed)
Pt called in for a refill of her amphetamine-dextroamphetamine (ADDERALL XR) 30 MG [facility] [address]

## 2018-07-24 NOTE — Telephone Encounter (Signed)
I have routed this request to Dr Dohmeier for review. The pt is due for the medication and [place] registry was verified.  

## 2018-07-30 ENCOUNTER — Encounter: Payer: Self-pay | Admitting: Neurology

## 2018-07-30 NOTE — Progress Notes (Signed)
[facility]  PATIENT: Alyssa Bass DOB: [birth year]   REASON FOR VISIT: Follow-up for multiple sclerosis and obstructive sleep apnea here for CPAP compliance  Inability to focus HISTORY FROM: Patient    HISTORY OF PRESENT ILLNESS:UPDATE 1/21/2020CM Alyssa Bass, 53 year old female returns for follow-up history of obstructive sleep apnea here for CPAP compliance.  She also has a history of relapsing remitting multiple sclerosis and is currently on Tecfidera twice daily without side effects.  She denies any exacerbations of her multiple sclerosis.  Last MRI 2014.  She did not get her MRI at last visit due to financial reasons.  CPAP compliance 05/02/2018-07/30/2018 shows compliance greater than 4 hours at 100%.  Average usage 8 hours 34 minutes set pressure 10 cm.  EPR level 3 leak 95th percentile 6.3 AHI 0.5.  ESS score 7.  She continues to work full-time in an [employer].  MOCA 25/30. She returns for reevaluation   2/2/19CDPamela Bass is a 53 y.o. female , seen here as a revisit  from Dr. Madilyn Fireman for MS and OSA follow up,  Patient uses a CPAP machine that is about 50 years old, cannot be interrogated by Wi-Fi. Do not have compliance data for her CPAP today but she states that her CPAP therapy is going well, but she has been using the same settings and supplies.  There has been no need to change interfaces, humidity settings or otherwise.  If she needs a new machine she would definitely be allowed to get one. Patient was presenting  in 1997 with symptoms of optic neuritis which by the year 2000 for joint by symptoms of sensory abnormalities , and she was finally diagnosed with multiple sclerosis.  Spinal test revealed in 2000 that oligoclonal bands were present and establish the diagnosis. She was followed for a while by Dr. Morene Antu, and  by [facility] transiently seen Dr. 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 She has been followed in our  office again since 2015. In our last visit we discussed repeating an MRI but her co-pay was very high and she decided to wait.  She would like of course to know if the colloid cyst has grown and also if there are new lesions.  She had clinically no evidence of an MS relapse on Tecfidera. She was last seen in 2015- she has been using adderall for daytime sleepiness.  She has reported delayed memory- names , words, but she is not getting lost.  She performed today excellently on a Montreal cognitive assessment test 30 out of 30 points       (CD): after delivering a healthy baby daughter. A spinal tap had revealed only one band, but she developed optic neuritis in 2000, when she developed sensory symptoms. A repeat CSF test revealed than in 2000 the oligoclonal bands and established the diagnosis.  Alyssa Bass, a former patient of Dr. Morene Antu, has been followed by him for her multiple sclerosis- but she also carries a diagnosis of an AVM and intra-ventricular cyst.  To screen for progression in both diseases she has undergone regular MRIs. She is also followed at [facility] where she was seen, from where the more recent imaging studies are available . I have a copy here of her last MRI from 03/2011 showing that the patient has multiple foci of periventricular and deep white matter lesions but predominantly perivascular. Her white matter disease has gradually progressed since the Baseline image and examination in 2002.  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 There was also no significant change in the left frontal venous angioma. She has had elevated LFTs in the past, and a positive JC Virus antibody screen.   REVIEW OF SYSTEMS: Full 14 system review of systems performed and notable only for those listed, all others are neg:  Constitutional: neg  Cardiovascular: neg Ear/Nose/Throat: neg  Skin:  neg Eyes: Recent cataract surgery Respiratory: neg Gastroitestinal: neg  Hematology/Lymphatic: neg  Endocrine: Flushing, Musculoskeletal:neg Allergy/Immunology: neg Neurological: Mild memory loss Psychiatric: Decreased concentration Sleep : Obstructive sleep apnea with CPAP   ALLERGIES: No Known Allergies  HOME MEDICATIONS: Outpatient Medications Prior to Visit  Medication Sig Dispense Refill  . amphetamine-dextroamphetamine (ADDERALL XR) 30 MG 24 hr capsule Take 1 capsule (30 mg total) by mouth daily. 30 capsule 0  . folic acid (FOLVITE) 1 MG tablet Take 1 mg by mouth daily.  4  . lisinopril-hydrochlorothiazide (PRINZIDE,ZESTORETIC) 20-12.5 MG tablet TAKE 1 TABLET DAILY (DUE FOR FOLLOW UP VISIT) 90 tablet 1  . Propylene Glycol (SYSTANE COMPLETE OP) Apply to eye.    . sertraline (ZOLOFT) 100 MG tablet Take 2 tablets (200 mg total) by mouth daily. 180 tablet 1  . TECFIDERA 240 MG CPDR TAKE 1 CAPSULE (240 MG) TWICE A DAY 180 capsule 2  . Olopatadine HCl 0.2 % SOLN Apply 1 drop to eye daily. As needed 1 Bottle 3   No facility-administered medications prior to visit.     PAST MEDICAL HISTORY: Past Medical History:  Diagnosis Date  . 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 Depression   . Hypertension   . MS (multiple sclerosis) ([place])   . Tachycardia     PAST SURGICAL HISTORY: Past Surgical History:  Procedure Laterality Date  . ACHILLES TENDON SURGERY     Hagland deformity  . COLONOSCOPY W/ POLYPECTOMY    . COLONOSCOPY WITH PROPOFOL N/A 05/01/2015   Procedure: COLONOSCOPY WITH PROPOFOL;  Surgeon: Laurence Spates, MD;  Location: [facility];  Service: Endoscopy;  Laterality: N/A;  . Left ankle surgery  2004    FAMILY HISTORY: Family History  Problem Relation Age of Onset  . Cancer Unknown        colon  . Heart attack Unknown        grandmother  . Hypertension Brother   . Hypertension Mother   . Sleep apnea Mother   . Sleep apnea Brother   . Sleep apnea  Sister   . Cancer Father        pre cancerous colon    SOCIAL HISTORY: Social History   Socioeconomic History  . Marital status: Single    Spouse name: Elta Guadeloupe  . Number of children: 2  . Years of education: 83  . Highest education level: Not on file  Occupational History    Employer: [employer]    Comment: Statistician  Social Needs  . Financial resource strain: Not on file  . Food insecurity:    Worry: Not on file    Inability: Not on file  . Transportation needs:    Medical: Not on file    Non-medical: Not on file  Tobacco Use  . Smoking status: Former Smoker    Packs/day: 1.00    Types: Cigarettes    Last attempt to quit: 08/11/2015    Years since quitting: 2.9  .  Smokeless tobacco: Never Used  Substance and Sexual Activity  . Alcohol use: Yes    Alcohol/week: 0.0 standard drinks    Comment: occas.  . Drug use: No  . Sexual activity: Not on file  Lifestyle  . Physical activity:    Days per week: Not on file    Minutes per session: Not on file  . Stress: Not on file  Relationships  . Social connections:    Talks on phone: Not on file    Gets together: Not on file    Attends religious service: Not on file    Active member of club or organization: Not on file    Attends meetings of clubs or organizations: Not on file    Relationship status: Not on file  . Intimate partner violence:    Fear of current or ex partner: Not on file    Emotionally abused: Not on file    Physically abused: Not on file    Forced sexual activity: Not on file  Other Topics Concern  . 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   Patient has two children.   Patient is working full-time.   Patient has a college degree.   Patient is right-handed.   Patient drinks 2 cups of coffee daily.        PHYSICAL EXAM  Vitals:   07/31/18 0747  BP: 139/88  Pulse: 79  Weight: 217 lb 3.2 oz (98.5 kg)    Height: 5\' 4"  (1.626 m)   Body mass index is 37.28 kg/m.  Generalized: Well developed, obese female in no acute distress  Head: normocephalic and atraumatic,. Oropharynx benign mallopatti3 Neck: Supple, circumference 17 Lungs clear Musculoskeletal: No deformity  Skin no rash or edema Neurological examination   Mentation: Alert oriented to time, place, history taking. Attention span and concentration appropriate.   Follows all commands speech and language fluent.  Montreal Cognitive Assessment  07/31/2018 08/22/2017  Visuospatial/ Executive (0/5) 4 5  Naming (0/3) 2 3  Attention: Read list of digits (0/2) 2 2  Attention: Read list of letters (0/1) 1 1  Attention: Serial 7 subtraction starting at 100 (0/3) 3 3  Language: Repeat phrase (0/2) 2 2  Language : Fluency (0/1) 1 1  Abstraction (0/2) 2 2  Delayed Recall (0/5) 2 5  Orientation (0/6) 6 6  Total 25 30   Cranial nerve II-XII: Fundoscopic exam reveals sharp disc margins.Pupils were equal round reactive to light extraocular movements were full, visual field were full on confrontational test. Facial sensation and strength were normal. hearing was intact to finger rubbing bilaterally. Uvula tongue midline. head turning and shoulder shrug were normal and symmetric.Tongue protrusion into cheek strength was normal. Motor: normal bulk and tone, full strength in the BUE, BLE,  Sensory: normal and symmetric to light touch, pinprick, and  Vibration, in the upper and lower extremities  Coordination: finger-nose-finger, heel-to-shin bilaterally, no dysmetria Reflexes: Brachioradialis 2/2, biceps 2/2, triceps 2/2, patellar 2/2, Achilles 2/2, plantar responses were flexor bilaterally. Gait and Station: Rising up from seated position without assistance, normal stance,  moderate stride, good arm swing, smooth turning, able to perform tiptoe, and heel walking without difficulty. Tandem gait is steady  DIAGNOSTIC DATA (LABS, IMAGING, TESTING) - I  reviewed patient records, labs, notes, testing and imaging myself where available.  Lab Results  Component Value Date   WBC 5.4 08/22/2017   HGB 13.5  08/22/2017   HCT 39.9 08/22/2017   MCV 97 08/22/2017   PLT 268 08/22/2017      Component Value Date/Time   NA 139 04/16/2018 0756   NA 139 08/22/2017 1619   K 4.4 04/16/2018 0756   CL 103 04/16/2018 0756   CO2 27 04/16/2018 0756   GLUCOSE 108 04/16/2018 0756   BUN 14 04/16/2018 0756   BUN 18 08/22/2017 1619   CREATININE 0.58 04/16/2018 0756   CALCIUM 9.4 04/16/2018 0756   PROT 7.2 08/22/2017 1619   ALBUMIN 4.6 08/22/2017 1619   AST 13 08/22/2017 1619   ALT 12 08/22/2017 1619   ALKPHOS 60 08/22/2017 1619   BILITOT 0.2 08/22/2017 1619   GFRNONAA 107 04/16/2018 0756   GFRAA 125 04/16/2018 0756   Lab Results  Component Value Date   CHOL 187 08/22/2017   HDL 70 08/22/2017   LDLCALC 100 (H) 08/22/2017   TRIG 83 08/22/2017   CHOLHDL 2.7 08/22/2017   Lab Results  Component Value Date   HGBA1C 5.5 04/16/2018   No results found for: VITAMINB12 Lab Results  Component Value Date   TSH 1.502 06/17/2013      ASSESSMENT AND PLAN  53 y.o. year old female  has a past medical history of Colloid cyst of brain ([place]), Depression, Hypertension, MS (multiple sclerosis) ([place]), obstructive sleep apnea here for CPAP compliance and claims of inability to focus on Adderall.  Patient did not get MRI after her last visit due to financial concerns.CPAP compliance 05/02/2018-07/30/2018 shows compliance greater than 4 hours at 100%.  Average usage 8 hours 34 minutes set pressure 10 cm.  EPR level 3 leak 95th percentile 6.3 AHI 0.5.  ESS score 7.  Plan:  Continue AdderallXR 30 mg daily just refilled  We will repeat MRI of the brain with and without and compare to previous to follow progression of MS last 2014 MOCA 25/30 Continue Tecfidera twice daily will refill Labs today CBC CMP CPAP compliance 100% Continue  same settings Follow up 6  months Dennie Bible, [facility], [facility], [facility] [address] [place], [place] [address] [phone]

## 2018-07-31 ENCOUNTER — Encounter: Payer: Self-pay | Admitting: Nurse Practitioner

## 2018-07-31 ENCOUNTER — Ambulatory Visit (INDEPENDENT_AMBULATORY_CARE_PROVIDER_SITE_OTHER): Payer: 59 | Admitting: Nurse Practitioner

## 2018-07-31 VITALS — BP 139/88 | HR 79 | Ht 64.0 in | Wt 217.2 lb

## 2018-07-31 DIAGNOSIS — Z9989 Dependence on other enabling machines and devices: Secondary | ICD-10-CM | POA: Diagnosis not present

## 2018-07-31 DIAGNOSIS — G35 Multiple sclerosis: Secondary | ICD-10-CM

## 2018-07-31 DIAGNOSIS — G4733 Obstructive sleep apnea (adult) (pediatric): Secondary | ICD-10-CM | POA: Diagnosis not present

## 2018-07-31 MED ORDER — DIMETHYL FUMARATE 240 MG PO CPDR: DELAYED_RELEASE_CAPSULE | ORAL | 2 refills | Status: DC

## 2018-07-31 NOTE — Progress Notes (Addendum)
Community note sent to [facility], re: new supplies needed. Received reply from [facility], got order.

## 2018-07-31 NOTE — Patient Instructions (Signed)
Continue AdderallXR 30 mg daily just refilled  We will repeat MRI of the brain with and without and compare to previous to follow progression of MS last 2014 Continue Tecfidera twice daily Labs today CBC CMP CPAP compliance 100% Continue  same settings Follow up 6 months

## 2018-08-01 ENCOUNTER — Telehealth: Payer: Self-pay | Admitting: Nurse Practitioner

## 2018-08-01 ENCOUNTER — Encounter: Payer: Self-pay | Admitting: *Deleted

## 2018-08-01 LAB — COMPREHENSIVE METABOLIC PANEL
ALBUMIN: 4.5 g/dL (ref 3.8–4.8)
ALK PHOS: 54 IU/L (ref 39–117)
ALT: 14 IU/L (ref 0–32)
AST: 13 IU/L (ref 0–40)
Albumin/Globulin Ratio: 1.6 (ref 1.2–2.2)
BUN / CREAT RATIO: 18 (ref 9–23)
BUN: 13 mg/dL (ref 6–24)
Bilirubin Total: 0.2 mg/dL (ref 0.0–1.2)
CO2: 23 mmol/L (ref 20–29)
CREATININE: 0.74 mg/dL (ref 0.57–1.00)
Calcium: 10 mg/dL (ref 8.7–10.2)
Chloride: 99 mmol/L (ref 96–106)
GFR calc Af Amer: 109 mL/min/{1.73_m2} (ref >59)
GFR calc non Af Amer: 95 mL/min/{1.73_m2} (ref >59)
GLUCOSE: 93 mg/dL (ref 65–99)
Globulin, Total: 2.8 g/dL (ref 1.5–4.5)
Potassium: 4.8 mmol/L (ref 3.5–5.2)
Sodium: 139 mmol/L (ref 134–144)
Total Protein: 7.3 g/dL (ref 6.0–8.5)

## 2018-08-01 LAB — CBC WITH DIFFERENTIAL/PLATELET
Basophils Absolute: 0.1 10*3/uL (ref 0.0–0.2)
Basos: 1 %
EOS (ABSOLUTE): 0.1 10*3/uL (ref 0.0–0.4)
Eos: 3 %
HEMATOCRIT: 40.8 % (ref 34.0–46.6)
Hemoglobin: 13.9 g/dL (ref 11.1–15.9)
Immature Grans (Abs): 0 10*3/uL (ref 0.0–0.1)
Immature Granulocytes: 0 %
Lymphocytes Absolute: 1.1 10*3/uL (ref 0.7–3.1)
Lymphs: 24 %
MCH: 32.3 pg (ref 26.6–33.0)
MCHC: 34.1 g/dL (ref 31.5–35.7)
MCV: 95 fL (ref 79–97)
Monocytes Absolute: 0.5 10*3/uL (ref 0.1–0.9)
Monocytes: 10 %
NEUTROS PCT: 62 %
Neutrophils Absolute: 2.8 10*3/uL (ref 1.4–7.0)
PLATELETS: 308 10*3/uL (ref 150–450)
RBC: 4.3 x10E6/uL (ref 3.77–5.28)
RDW: 11.8 % (ref 11.7–15.4)
WBC: 4.6 10*3/uL (ref 3.4–10.8)

## 2018-08-01 NOTE — Telephone Encounter (Signed)
[employer] order sent to GI patient is aware.

## 2018-08-04 ENCOUNTER — Other Ambulatory Visit: Payer: Commercial Managed Care - PPO

## 2018-08-11 ENCOUNTER — Ambulatory Visit
Admission: RE | Admit: 2018-08-11 | Discharge: 2018-08-11 | Disposition: A | Discharge status: Home / Self Care | Payer: 59 | Source: Ambulatory Visit | Attending: Nurse Practitioner | Admitting: Nurse Practitioner

## 2018-08-11 DIAGNOSIS — G35 Multiple sclerosis: Secondary | ICD-10-CM

## 2018-08-11 MED ORDER — GADOBENATE DIMEGLUMINE 529 MG/ML IV SOLN
18.0000 mL | Freq: Once | INTRAVENOUS | Status: AC | PRN
  Administered 2018-08-11: 18 mL via INTRAVENOUS

## 2018-08-12 ENCOUNTER — Other Ambulatory Visit: Payer: Self-pay | Admitting: Family Medicine

## 2018-08-12 DIAGNOSIS — I10 Essential (primary) hypertension: Secondary | ICD-10-CM

## 2018-08-13 NOTE — Telephone Encounter (Signed)
Must make appointment 

## 2018-08-14 ENCOUNTER — Telehealth: Payer: Self-pay | Admitting: *Deleted

## 2018-08-14 NOTE — Telephone Encounter (Signed)
pls get prior imaging disc to compare. Not in canopy pacs. -VRP

## 2018-08-14 NOTE — Telephone Encounter (Signed)
-----   Message from Dennie Bible, NP sent at 08/13/2018 11:52 AM EST ----- MRI of the brain with chronic myelinating plaques no acute plaques seen no acute findings.  Please call the patient

## 2018-08-14 NOTE — Telephone Encounter (Signed)
Called pt to give results of her MRI per CM/NP result note.  She stated that they had been monitoring colloid cyst and she is asking for a comparison from last MRI, [facility] 2014.  Also comparison relating to her MS plaques.  Please advise.  Her previous name : listed under [facility].

## 2018-08-16 NOTE — Telephone Encounter (Signed)
Alyssa Bass in MR to get from [facility].

## 2018-08-21 NOTE — Telephone Encounter (Signed)
I requested pt cd from [facility] on 08/15/18

## 2018-08-21 NOTE — Telephone Encounter (Signed)
Spoke to pt and relayed that waiting on dvd/cd from [facility].  She verbalized understanding.

## 2018-08-22 NOTE — Telephone Encounter (Signed)
Disc received and placed on you desk for review.

## 2018-08-24 NOTE — Telephone Encounter (Signed)
Thank you for reviewing the comparison films. Larey Seat, MD

## 2018-08-24 NOTE — Telephone Encounter (Signed)
Scan compared to disc. No significant change. -VRP

## 2018-08-24 NOTE — Telephone Encounter (Signed)
Spoke with patient and informed her Dr Leta Baptist reviewed and compared disc to scan and stated no significant change. Patient verbalized understanding, appreciation.

## 2018-08-27 ENCOUNTER — Other Ambulatory Visit: Payer: Self-pay | Admitting: Neurology

## 2018-08-27 ENCOUNTER — Telehealth: Payer: Self-pay | Admitting: Neurology

## 2018-08-27 NOTE — Telephone Encounter (Signed)
Pt is needing a refill for her amphetamine-dextroamphetamine (ADDERALL XR) 30 MG 24 hr [facility] [address]

## 2018-08-27 NOTE — Telephone Encounter (Signed)
I have routed this request to Dr Dohmeier for review. The pt is due for the medication and [place] registry was verified.  

## 2018-08-28 MED ORDER — AMPHETAMINE-DEXTROAMPHET ER 30 MG PO CP24: 30.0000 mg | ORAL_CAPSULE | Freq: Every day | ORAL | 0 refills | Status: DC

## 2018-09-04 ENCOUNTER — Other Ambulatory Visit: Payer: Self-pay

## 2018-09-04 DIAGNOSIS — I10 Essential (primary) hypertension: Secondary | ICD-10-CM

## 2018-09-04 MED ORDER — LISINOPRIL-HYDROCHLOROTHIAZIDE 20-12.5 MG PO TABS: 1.0000 | ORAL_TABLET | Freq: Every day | ORAL | 0 refills | Status: DC

## 2018-09-27 ENCOUNTER — Telehealth: Payer: Self-pay | Admitting: Neurology

## 2018-09-27 ENCOUNTER — Other Ambulatory Visit: Payer: Self-pay | Admitting: Neurology

## 2018-09-27 MED ORDER — AMPHETAMINE-DEXTROAMPHET ER 30 MG PO CP24: 30.0000 mg | ORAL_CAPSULE | Freq: Every day | ORAL | 0 refills | Status: DC

## 2018-09-27 NOTE — Telephone Encounter (Signed)
I have routed this request to Dr Dohmeier for review. The pt is due for the medication and [place] registry was verified.  

## 2018-09-27 NOTE — Telephone Encounter (Signed)
Pt is needing a refill on her amphetamine-dextroamphetamine (ADDERALL XR) 30 MG 24 hr capsule [facility] [address].

## 2018-10-01 ENCOUNTER — Other Ambulatory Visit: Payer: Self-pay | Admitting: Neurology

## 2018-10-01 MED ORDER — DIMETHYL FUMARATE 240 MG PO CPDR: DELAYED_RELEASE_CAPSULE | ORAL | 2 refills | Status: DC

## 2018-10-26 ENCOUNTER — Telehealth: Payer: Self-pay | Admitting: Neurology

## 2018-10-26 DIAGNOSIS — Z9989 Dependence on other enabling machines and devices: Secondary | ICD-10-CM

## 2018-10-26 DIAGNOSIS — G4733 Obstructive sleep apnea (adult) (pediatric): Secondary | ICD-10-CM

## 2018-10-26 NOTE — Telephone Encounter (Signed)
Pt is needing a refill on her amphetamine-dextroamphetamine (ADDERALL XR) 30 MG 24 hr capsule [facility] [address]. She also states that she has been having problems with her CPAP and that [facility] informed her that she needs to have [place] send a replacement order to them in order for them to replace it. Please advise.

## 2018-10-29 ENCOUNTER — Other Ambulatory Visit: Payer: Self-pay | Admitting: Neurology

## 2018-10-29 MED ORDER — AMPHETAMINE-DEXTROAMPHET ER 30 MG PO CP24: 30.0000 mg | ORAL_CAPSULE | Freq: Every day | ORAL | 0 refills | Status: DC

## 2018-10-29 NOTE — Telephone Encounter (Signed)
Order sent to [facility] for the patient for CPAP. I have routed this request for medication refill to Dr Brett Fairy for review. The pt is due for the medication and [place] registry was verified.

## 2018-10-30 ENCOUNTER — Ambulatory Visit: Payer: 59 | Admitting: Family Medicine

## 2018-11-01 ENCOUNTER — Other Ambulatory Visit: Payer: Self-pay | Admitting: Neurology

## 2018-11-01 DIAGNOSIS — G4733 Obstructive sleep apnea (adult) (pediatric): Secondary | ICD-10-CM

## 2018-11-01 DIAGNOSIS — Z9989 Dependence on other enabling machines and devices: Secondary | ICD-10-CM

## 2018-11-28 ENCOUNTER — Other Ambulatory Visit: Payer: Self-pay | Admitting: Neurology

## 2018-11-28 ENCOUNTER — Telehealth: Payer: Self-pay | Admitting: Neurology

## 2018-11-28 MED ORDER — AMPHETAMINE-DEXTROAMPHET ER 30 MG PO CP24: 30.0000 mg | ORAL_CAPSULE | Freq: Every day | ORAL | 0 refills | Status: DC

## 2018-11-28 NOTE — Telephone Encounter (Signed)
Pt is needing a refill on her phetamine-dextroamphetamine (ADDERALL XR) 30 MG 24 hr capsule [facility] [address]

## 2018-12-05 ENCOUNTER — Other Ambulatory Visit: Payer: Self-pay | Admitting: Family Medicine

## 2018-12-05 DIAGNOSIS — I10 Essential (primary) hypertension: Secondary | ICD-10-CM

## 2018-12-10 ENCOUNTER — Encounter: Payer: Self-pay | Admitting: Family Medicine

## 2018-12-10 ENCOUNTER — Ambulatory Visit (INDEPENDENT_AMBULATORY_CARE_PROVIDER_SITE_OTHER): Payer: 59 | Admitting: Family Medicine

## 2018-12-10 VITALS — BP 124/68 | HR 65 | Ht 64.0 in | Wt 215.0 lb

## 2018-12-10 DIAGNOSIS — I10 Essential (primary) hypertension: Secondary | ICD-10-CM | POA: Diagnosis not present

## 2018-12-10 DIAGNOSIS — R7301 Impaired fasting glucose: Secondary | ICD-10-CM

## 2018-12-10 DIAGNOSIS — E669 Obesity, unspecified: Secondary | ICD-10-CM

## 2018-12-10 DIAGNOSIS — R21 Rash and other nonspecific skin eruption: Secondary | ICD-10-CM | POA: Diagnosis not present

## 2018-12-10 LAB — POCT GLYCOSYLATED HEMOGLOBIN (HGB A1C): Hemoglobin A1C: 5.4 % (ref 4.0–5.6)

## 2018-12-10 MED ORDER — LISINOPRIL-HYDROCHLOROTHIAZIDE 20-12.5 MG PO TABS: 1.0000 | ORAL_TABLET | Freq: Every day | ORAL | 1 refills | Status: DC

## 2018-12-10 MED ORDER — CLOTRIMAZOLE-BETAMETHASONE 1-0.05 % EX CREA: 1.0000 "application " | TOPICAL_CREAM | Freq: Two times a day (BID) | CUTANEOUS | 0 refills | Status: DC

## 2018-12-10 NOTE — Progress Notes (Signed)
Established Patient Office Visit  Subjective:  Patient ID: Alyssa Bass, female    DOB: October 25, 1965  Age: 53 y.o. MRN: [id number]  CC:  Chief Complaint  Patient presents with  . Hypertension  . ifg    HPI Alyssa Bass presents for  Hypertension- Pt denies chest pain, SOB, dizziness, or heart palpitations.  Taking meds as directed w/o problems.  Denies medication side effects.    Impaired fasting glucose-no increased thirst or urination. No symptoms consistent with hypoglycemia.    She also complains of a rash behind her left ear that is been present for maybe a month or more.  She has not tried any particular treatments.  She thought initially it might just be from irritation from glasses but says that it just really has not gone away.  Occasionally itchy.  Past Medical History:  Diagnosis Date  . 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 Depression   . Hypertension   . MS (multiple sclerosis) ([place])   . Tachycardia     Past Surgical History:  Procedure Laterality Date  . ACHILLES TENDON SURGERY     Hagland deformity  . COLONOSCOPY W/ POLYPECTOMY    . COLONOSCOPY WITH PROPOFOL N/A 05/01/2015   Procedure: COLONOSCOPY WITH PROPOFOL;  Surgeon: Laurence Spates, MD;  Location: [facility];  Service: Endoscopy;  Laterality: N/A;  . Left ankle surgery  2004    Family History  Problem Relation Age of Onset  . Hypertension Mother   . Sleep apnea Mother   . Cancer Father        pre cancerous colon  . Cancer Other        colon  . Heart attack Other        grandmother  . Hypertension Brother   . Sleep apnea Brother   . Sleep apnea Sister     Social History   Socioeconomic History  . Marital status: Single    Spouse name: Elta Guadeloupe  . Number of children: 2  . Years of education: 22  . Highest education level: Not on file  Occupational History    Employer: [employer]    Comment: Statistician  Social Needs  . Financial resource strain: Not on file   . Food insecurity:    Worry: Not on file    Inability: Not on file  . Transportation needs:    Medical: Not on file    Non-medical: Not on file  Tobacco Use  . Smoking status: Former Smoker    Packs/day: 1.00    Types: Cigarettes    Last attempt to quit: 08/11/2015    Years since quitting: 3.3  . Smokeless tobacco: Never Used  Substance and Sexual Activity  . Alcohol use: Yes    Alcohol/week: 0.0 standard drinks    Comment: occas.  . Drug use: No  . Sexual activity: Not on file  Lifestyle  . Physical activity:    Days per week: Not on file    Minutes per session: Not on file  . Stress: Not on file  Relationships  . Social connections:    Talks on phone: Not on file    Gets together: Not on file    Attends religious service: Not on file    Active member of club or organization: Not on file    Attends meetings of clubs or organizations: Not on file    Relationship status: Not on file  . Intimate partner violence:  Fear of current or ex partner: Not on file    Emotionally abused: Not on file    Physically abused: Not on file    Forced sexual activity: Not on file  Other Topics Concern  . 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   Patient has two children.   Patient is working full-time.   Patient has a college degree.   Patient is right-handed.   Patient drinks 2 cups of coffee daily.       Outpatient Medications Prior to Visit  Medication Sig Dispense Refill  . amphetamine-dextroamphetamine (ADDERALL XR) 30 MG 24 hr capsule Take 1 capsule (30 mg total) by mouth daily. 30 capsule 0  . Dimethyl Fumarate (TECFIDERA) 240 MG CPDR TAKE 1 CAPSULE (240 MG) TWICE A DAY 510 capsule 2  . folic acid (FOLVITE) 1 MG tablet Take 1 mg by mouth daily.  4  . Propylene Glycol (SYSTANE COMPLETE OP) Apply to eye.    . sertraline (ZOLOFT) 100 MG tablet Take 2 tablets (200 mg total) by mouth daily. 180 tablet 1   . valACYclovir (VALTREX) 500 MG tablet Take 500 mg by mouth daily.    [name] Kitchen lisinopril-hydrochlorothiazide (PRINZIDE,ZESTORETIC) 20-12.5 MG tablet Take 1 tablet by mouth daily. MUST MAKE APPOINTMENT 90 tablet 0   No facility-administered medications prior to visit.     No Known Allergies  ROS Review of Systems    Objective:    Physical Exam  Constitutional: She is oriented to person, place, and time. She appears well-developed and well-nourished.  HENT:  Head: Normocephalic and atraumatic.  Cardiovascular: Normal rate, regular rhythm and normal heart sounds.  Pulmonary/Chest: Effort normal and breath sounds normal.  Neurological: She is alert and oriented to person, place, and time.  Skin: Skin is warm and dry.  Does have an erythematous papular dry scaly rash behind the left ear near the crease.  Psychiatric: She has a normal mood and affect. Her behavior is normal.    BP 124/68   Pulse 65   Ht 5\' 4"  (1.626 m)   Wt 215 lb (97.5 kg)   SpO2 99%   BMI 36.90 kg/m  Wt Readings from Last 3 Encounters:  12/10/18 215 lb (97.5 kg)  07/31/18 217 lb 3.2 oz (98.5 kg)  04/16/18 212 lb (96.2 kg)     There are no preventive care reminders to display for this patient.  There are no preventive care reminders to display for this patient.  Lab Results  Component Value Date   TSH 1.502 06/17/2013   Lab Results  Component Value Date   WBC 4.6 07/31/2018   HGB 13.9 07/31/2018   HCT 40.8 07/31/2018   MCV 95 07/31/2018   PLT 308 07/31/2018   Lab Results  Component Value Date   NA 139 07/31/2018   K 4.8 07/31/2018   CO2 23 07/31/2018   GLUCOSE 93 07/31/2018   BUN 13 07/31/2018   CREATININE 0.74 07/31/2018   BILITOT 0.2 07/31/2018   ALKPHOS 54 07/31/2018   AST 13 07/31/2018   ALT 14 07/31/2018   PROT 7.3 07/31/2018   ALBUMIN 4.5 07/31/2018   CALCIUM 10.0 07/31/2018   Lab Results  Component Value Date   CHOL 187 08/22/2017   Lab Results  Component Value Date   HDL 70  08/22/2017   Lab Results  Component Value Date   LDLCALC 100 (H) 08/22/2017   Lab Results  Component Value Date   TRIG 83 08/22/2017   Lab Results  Component Value Date   CHOLHDL 2.7 08/22/2017   Lab Results  Component Value Date   HGBA1C 5.4 12/10/2018      Assessment & Plan:   Problem List Items Addressed This Visit      Cardiovascular and Mediastinum   HYPERTENSION, BENIGN   Relevant Medications   lisinopril-hydrochlorothiazide (ZESTORETIC) 20-12.5 MG tablet     Endocrine   IFG (impaired fasting glucose) - Primary    [place] looks phenomenal today.  Just continue to keep an eye on it and plan to recheck again in 1 year.      Relevant Orders   POCT glycosylated hemoglobin (Hb A1C) (Completed)     Other   Obesity (BMI 30-39.9) (Chronic)    Other Visit Diagnoses    Rash and nonspecific skin eruption          Rash behind left ear most consistent with seborrheic dermatitis-we will go ahead and treat with Lotrisone cream.  BMI 36-she is actually doing really well.  She is been trying to take exercise breaks since she is been working from home and has been walking her dog at lunch.  Meds ordered this encounter  Medications  . lisinopril-hydrochlorothiazide (ZESTORETIC) 20-12.5 MG tablet    Sig: Take 1 tablet by mouth daily.    Dispense:  90 tablet    Refill:  1  . clotrimazole-betamethasone (LOTRISONE) cream    Sig: Apply 1 application topically 2 (two) times daily.    Dispense:  30 g    Refill:  0    Follow-up: Return in about 6 months (around 06/11/2019) for IFG/BP.    Beatrice Lecher, MD

## 2018-12-10 NOTE — Assessment & Plan Note (Signed)
Alyssa Bass looks phenomenal today.  Just continue to keep an eye on it and plan to recheck again in 1 year.

## 2018-12-13 ENCOUNTER — Other Ambulatory Visit: Payer: Self-pay | Admitting: Family Medicine

## 2018-12-28 ENCOUNTER — Telehealth: Payer: Self-pay | Admitting: Nurse Practitioner

## 2018-12-28 NOTE — Telephone Encounter (Signed)
Pt has called for a refill on her  amphetamine-dextroamphetamine (ADDERALL XR) 30 MG 24 hr capsule  [id number] Pt aware office not open on Fridays and to check with pharmacy on Monday

## 2018-12-31 ENCOUNTER — Other Ambulatory Visit: Payer: Self-pay | Admitting: Neurology

## 2018-12-31 MED ORDER — AMPHETAMINE-DEXTROAMPHET ER 30 MG PO CP24: 30.0000 mg | ORAL_CAPSULE | Freq: Every day | ORAL | 0 refills | Status: DC

## 2018-12-31 NOTE — Telephone Encounter (Signed)
I have routed this request to Dr Dohmeier for review. The pt is due for the medication and [place] registry was verified.  

## 2019-01-29 ENCOUNTER — Telehealth: Payer: Self-pay | Admitting: Neurology

## 2019-01-29 ENCOUNTER — Other Ambulatory Visit: Payer: Self-pay | Admitting: Neurology

## 2019-01-29 MED ORDER — AMPHETAMINE-DEXTROAMPHET ER 30 MG PO CP24: 30.0000 mg | ORAL_CAPSULE | Freq: Every day | ORAL | 0 refills | Status: DC

## 2019-01-29 NOTE — Telephone Encounter (Signed)
Patient requests refill of Adderall. Has an apt tomorrow. Best call back [phone]

## 2019-01-29 NOTE — Telephone Encounter (Signed)
I have routed this request to Dr Dohmeier for review. The pt is due for the medication and [place] registry was verified.  

## 2019-01-30 ENCOUNTER — Encounter: Payer: Self-pay | Admitting: Neurology

## 2019-01-30 ENCOUNTER — Ambulatory Visit (INDEPENDENT_AMBULATORY_CARE_PROVIDER_SITE_OTHER): Payer: 59 | Admitting: Neurology

## 2019-01-30 ENCOUNTER — Other Ambulatory Visit: Payer: Self-pay

## 2019-01-30 VITALS — BP 144/91 | HR 75 | Temp 98.6°F | Ht 63.75 in | Wt 214.0 lb

## 2019-01-30 DIAGNOSIS — G4733 Obstructive sleep apnea (adult) (pediatric): Secondary | ICD-10-CM

## 2019-01-30 DIAGNOSIS — Z5181 Encounter for therapeutic drug level monitoring: Secondary | ICD-10-CM

## 2019-01-30 DIAGNOSIS — G35 Multiple sclerosis: Secondary | ICD-10-CM

## 2019-01-30 DIAGNOSIS — E669 Obesity, unspecified: Secondary | ICD-10-CM

## 2019-01-30 MED ORDER — AMPHETAMINE-DEXTROAMPHET ER 30 MG PO CP24: 30.0000 mg | ORAL_CAPSULE | Freq: Every day | ORAL | 0 refills | Status: DC

## 2019-01-30 NOTE — Patient Instructions (Signed)
Dimethyl Fumarate oral delayed-release capsules What is this medicine? DIMETHYL FUMARATE (DYE meth il FUE ma rate) helps to decrease the number of multiple sclerosis relapses in people with relapsing-remitting forms of the disease. It is not a cure. This medicine may be used for other purposes; ask your health care provider or pharmacist if you have questions. COMMON BRAND NAME(S): Tecfidera What should I tell my health care provider before I take this medicine? They need to know if you have any of these conditions:  immune system problems  infection  liver disease  low blood counts, like white cells  an unusual or allergic reaction to dimethyl fumarate, diroximel fumarate, other medicines, foods, dyes, or preservatives  pregnant or trying to get pregnant  breast-feeding How should I use this medicine? Take this medicine by mouth with a glass of water. Follow the directions on the prescription label. Do not cut, crush or chew this medicine. Swallow the capsules whole. You can take it with or without food. If it upsets your stomach, take it with food. Take your medicine at regular intervals. Do not take your medicine more often than directed. Do not stop taking except on your doctor's advice. Talk to your pediatrician regarding the use of this medicine in children. Special care may be needed. Overdosage: If you think you have taken too much of this medicine contact a poison control center or emergency room at once. NOTE: This medicine is only for you. Do not share this medicine with others. What if I miss a dose? If you miss a dose, take it as soon as you can. If it is almost time for your next dose, take only that dose. Do not take double or extra doses. What may interact with this medicine? Do not take this medicine with the following medications:  diroximel fumarate This list may not describe all possible interactions. Give your health care provider a list of all the medicines,  herbs, non-prescription drugs, or dietary supplements you use. Also tell them if you smoke, drink alcohol, or use illegal drugs. Some items may interact with your medicine. What should I watch for while using this medicine? Tell your doctor or healthcare professional if your symptoms do not start to get better or of they get worse. You may need blood work done while you are taking this medicine. Taking your medicine with food may help reduce flushing. Call your doctor if the flushing bothers you or does not go away. In some patients, this medicine may cause a serious brain infection that may cause death. If you have any problems seeing, thinking, speaking, walking, or standing, tell your healthcare professional right away. If you cannot reach your healthcare professional, urgently seek other source of medicial care. What side effects may I notice from receiving this medicine? Side effects that you should report to your doctor or health care professional as soon as possible:  allergic reactions like skin rash, itching or hives, swelling of the face, lips, or tongue  fever or chills, sore throat  signs and symptoms of liver injury like dark yellow or brown urine; general ill feeling or flu-like symptoms; light-colored stools; loss of appetite; nausea; right upper belly pain; unusually weak or tired; yellowing of the eyes or skin  signs and symptoms of a serious brain infection like changes in vision, confusion, weakness on one side of the body, loss of balance or coordination, loss of memory, or changes in personality Side effects that usually do not require medical attention (report  to your doctor or health care professional if they continue or are bothersome):  diarrhea  facial flushing  nausea  stomach pain  upset stomach This list may not describe all possible side effects. Call your doctor for medical advice about side effects. You may report side effects to FDA at 1-800-FDA-1088. Where  should I keep my medicine? Keep out of the reach of children. Store at room temperature between 15 and 30 degrees C (59 and 86 degrees F). Keep this medicine in the original container. Protect from light. Throw away any unused medicine after the expiration date. NOTE: This sheet is a summary. It may not cover all possible information. If you have questions about this medicine, talk to your doctor, pharmacist, or health care provider.  2020 Elsevier/Gold Standard (2018-05-15 09:23:43)

## 2019-01-30 NOTE — Progress Notes (Signed)
[name]   Provider:  Larey Seat, MD  Referring Provider:  Primary Care Physician:  Alyssa Marry, MD  Chief Complaint  Patient presents with  . Follow-up    pt alone, rm 11. pt states that she started a new CPAP machine on 11/06/2018. patient uses the machine every night but it has not been connecting to wifi. DME is [facility] and they sent her a chip, so that we can download the machine. patient didnt realize that she needed to bring the card in and will bring that. she states uses it everynight.     HPI:  Alyssa Bass is a 53 y.o. female , seen here as a revisit  from Dr. Madilyn Fireman for MS and OSA follow up,   I had the pleasure of seeing Alyssa Bass today on 30 January 2019.  The patient has been followed in our sleep clinic for excessive daytime sleepiness but her current Epworth sleepiness score was only endorsed at 3 points, her fatigue severity at 27 out of 63 possible points.   She was given a new CPAP machine 11-06-2018 which had difficulties to pick up the data and it seems that [facility] ( [facility])  tried to guide her by telephone through the process but I have only available the data from 5/24 through 12/31/2018 and of those only the last 10 days are actually recording any data.   Therefor this is a short compliance visit.  The patient assured me that she is using her machine.  She also underwent a MOCA test, scorng 29/30 point.         Patient uses a CPAP machine that is about 51 years old, cannot be interrogated by Wi-Fi. Do not have compliance data for her CPAP today but she states that her CPAP therapy is going well, but she has been using the same settings and supplies.  There has been no need to change interfaces, humidity settings or otherwise.  If she needs a new machine she would definitely be allowed to get one. Patient was presenting  in 1997 with symptoms of optic neuritis which by the year 2000 for  joint by symptoms of sensory abnormalities , and she was finally diagnosed with multiple sclerosis.  Spinal test revealed in 2000 that oligoclonal bands were present and establish the diagnosis. She was followed for a while by Dr. Morene Antu, and  by [facility] transiently seen Dr. 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 She has been followed in our office again since 2015. In our last visit we discussed repeating an MRI but her co-pay was very high and she decided to wait.  She would like of course to know if the colloid cyst has grown and also if there are new lesions.  She had clinically no evidence of an MS relapse on Tecfidera. She was last seen in 2015- she has been using adderall for daytime sleepiness.  She has reported delayed memory- names , words, but she is not getting lost.  She performed today excellently on a Montreal cognitive assessment test 30 out of 30 points    HISTORY OF PRESENT ILLNESS:   (CD): after delivering a healthy baby daughter. A spinal tap had revealed only one band, but she developed optic neuritis in 2000, when she developed sensory symptoms. A repeat CSF test revealed than in 2000 the oligoclonal bands and established the diagnosis.  [name],  a former patient of Dr. Morene Antu, has been followed by him for her multiple sclerosis- but she also carries a diagnosis of an AVM and intra-ventricular cyst.  To screen for progression in both diseases she has undergone regular MRIs. She is also followed at [facility] where she was seen, from where the more recent imaging studies are available . I have a copy here of her last MRI from 03/2011 showing that the patient has multiple foci of periventricular and deep white matter lesions but predominantly perivascular. Her white matter disease has gradually progressed since the Baseline image and examination in 2002.  The colloid cyst was noted not to have increased in size and measured at this date 10 x  12 mm this was compared to a September MRI 2008. There was also no significant change in the left frontal venous angioma. She has had elevated LFTs in the past, and a positive JC Virus antibody screen.   05/12/14 (LL): Mrs. Castille returns for 6 month revisit, she has been doing well on Tecfidera, no relapses or new symptoms since last visit. Has flushing as her only side effect which she states is random times. She continues to feel that her memory is not what it once was and has occasional positional dizziness, but does not have it at this time. She continues to have mild numbness in her right fingers, this has been long-standing. She feels much better since switching to Adderall XR taking one a day because she often forget the second dose of the immediate release, when she remembered it was too late in the day to take it.   I reviewed the patient's CPAP compliance data from 02/11/14 to 05/11/14, which is a total of 90 days, during which time the patient used CPAP every day except for 1 day. The average usage for all days was 8 hours and 2 minutes. The percent used days greater than 4 hours was 98%, indicating excellent compliance. The residual AHI was 0.2 per hour, indicating an good treatment pressure of 10 cwp with EPR of 3. Air leak from the mask was median 0.8. She is using the nasal pillow, but has a concern with the mask strap causing hair loss behind the temples. She endorses a huge improvement in her sleep using he cpap.  01-03-14 CD  I see [name] today for a follow up on fatigue and excessive daytime sleepiness.  The patient is on CPAP for OSA and was followed by a [place] sleep clinic, Dr. Mickel Fuchs saw her. The dependence got closed.  She was diagnosed based on a overall AHI of only 7.8 and REM dependent AHi 22.4 with desaturations in oxygen.  CPAP has been used at 10 cm water since, with a nasal mask. She doesn't like the straps on her head.  She would be delighted to get a  new headset - and was happy to hear her download and compliance reviewed as high.  The download encompasses 453 days since the last download !. The patient has a 96% compliance, she was daily by time is 7 hours and 46 minutes and the residual AHI is only 0.2.  This is an almost complete resolution of apnea with one of the highest compliance is. Set pressures 10 cm to 3 cm EPR. She still has a moderate air leak.  I would like for her to try a nasal pillow .   11/08/13 (LL): [name] returns for followup, she has been on Tecfidera for about 4  months and loves it.  Interval history from 09/07/2016, I have pleasure of seeing Mrs. [name] after a 2 year hiatus. She has been followed by our nurse practitioners is a compliant CPAP user and here today for a compliance visit as well is discussing her MS status, she continues to be very happy with tecfidera treatments and has only occasionally flushing. The patient's download from her CPAP today reveals a residual apnea index of only 0.697% compliance for days and 90% compliance for over 4 hours of consecutive use, with average user time of 6 hours and 54 minutes. CPAP is set at 10 cm water with 3 cm expiratory pressure relief. Epworth sleepiness score was endorsed at 6 points fatigue severity score at 32 points. The patient reports that her divorce with stressful understandably, that her blood pressure and heart rate went up during times of legal fights, she paused her Adderall medication. She now has found her new baseline, continues to work productively and feels that she needs a refill on Adderall. Interestingly, she reports that Adderall increases her appetite. Her last MRI was  2-3 years ago., she reports memory decline.      Review of Systems: Out of a complete 14 system review, the patient complains of only the following symptoms, and all other reviewed systems are negative.  Memory loss,  Weight gain, HTN, inattentiveness, unable to focus.    Social History   Socioeconomic History  . Marital status: Single    Spouse name: Elta Guadeloupe  . Number of children: 2  . Years of education: 1  . Highest education level: Not on file  Occupational History    Employer: [employer]    Comment: Statistician  Social Needs  . Financial resource strain: Not on file  . Food insecurity    Worry: Not on file    Inability: Not on file  . Transportation needs    Medical: Not on file    Non-medical: Not on file  Tobacco Use  . Smoking status: Former Smoker    Packs/day: 1.00    Types: Cigarettes    Quit date: 08/11/2015    Years since quitting: 3.4  . Smokeless tobacco: Never Used  Substance and Sexual Activity  . Alcohol use: Yes    Alcohol/week: 0.0 standard drinks    Comment: occas.  . Drug use: No  . Sexual activity: Not on file  Lifestyle  . Physical activity    Days per week: Not on file    Minutes per session: Not on file  . Stress: Not on file  Relationships  . Social Herbalist on phone: Not on file    Gets together: Not on file    Attends religious service: Not on file    Active member of club or organization: Not on file    Attends meetings of clubs or organizations: Not on file    Relationship status: Not on file  . Intimate partner violence    Fear of current or ex partner: Not on file    Emotionally abused: Not on file    Physically abused: Not on file    Forced sexual activity: Not on file  Other Topics Concern  . 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   Patient has two children.   Patient is working full-time.   Patient has a college degree.   Patient is right-handed.  Patient drinks 2 cups of coffee daily.       Family History  Problem Relation Age of Onset  . Hypertension Mother   . Sleep apnea Mother   . Cancer Father        pre cancerous colon  . Cancer Other        colon  . Heart attack Other         grandmother  . Hypertension Brother   . Sleep apnea Brother   . Sleep apnea Sister     Past Medical History:  Diagnosis Date  . 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 Depression   . Hypertension   . MS (multiple sclerosis) ([facility])   . Tachycardia     Past Surgical History:  Procedure Laterality Date  . ACHILLES TENDON SURGERY     Hagland deformity  . COLONOSCOPY W/ POLYPECTOMY    . COLONOSCOPY WITH PROPOFOL N/A 05/01/2015   Procedure: COLONOSCOPY WITH PROPOFOL;  Surgeon: Laurence Spates, MD;  Location: [facility];  Service: Endoscopy;  Laterality: N/A;  . Left ankle surgery  2004    Current Outpatient Medications  Medication Sig Dispense Refill  . amphetamine-dextroamphetamine (ADDERALL XR) 30 MG 24 hr capsule Take 1 capsule (30 mg total) by mouth daily. 30 capsule 0  . clotrimazole-betamethasone (LOTRISONE) cream Apply 1 application topically 2 (two) times daily. 30 g 0  . Dimethyl Fumarate (TECFIDERA) 240 MG CPDR TAKE 1 CAPSULE (240 MG) TWICE A DAY 315 capsule 2  . folic acid (FOLVITE) 1 MG tablet Take 1 mg by mouth daily.  4  . lisinopril-hydrochlorothiazide (ZESTORETIC) 20-12.5 MG tablet Take 1 tablet by mouth daily. 90 tablet 1  . Propylene Glycol (SYSTANE COMPLETE OP) Apply to eye.    . sertraline (ZOLOFT) 100 MG tablet TAKE 2 TABLETS DAILY 180 tablet 3  . valACYclovir (VALTREX) 500 MG tablet Take 500 mg by mouth daily.     No current facility-administered medications for this visit.     Allergies as of 01/30/2019  . (No Known Allergies)    Vitals: BP (!) 144/91   Pulse 75   Temp 98.6 F (37 C)   Ht 5' 3.75" (1.619 m)   Wt 214 lb (97.1 kg)   BMI 37.02 kg/m  Last Weight:  Wt Readings from Last 1 Encounters:  01/30/19 214 lb (97.1 kg)   VVO:HYWV mass index is 37.02 kg/m.     Last Height:   Ht Readings from Last 1 Encounters:  01/30/19 5' 3.75" (1.619 m)    Physical exam:  General: The patient is awake, alert and appears not in acute  distress. The patient is well groomed. Head: Normocephalic, atraumatic. Neck is supple. Mallampati 3. neck circumference:17' . Nasal airflow patent  . Retrognathia is not seen. Crowded dental status. invisaline user.   Cardiovascular:  Regular rate and rhythm , without  murmurs or carotid bruit, and without distended neck veins. Respiratory: Lungs are clear to auscultation. Skin:  Without evidence of edema, or rash Trunk: BMI is 37 kg/m2 . The patient's posture is erect   Neurologic exam : The patient is awake and alert, oriented to place and time.   Memory subjective impaired  MOCA: Montreal Cognitive Assessment  01/30/2019 07/31/2018 08/22/2017  Visuospatial/ Executive (0/5) 5 4 5   Naming (0/3) 3 2 3   Attention: Read list of digits (0/2) 2 2 2   Attention: Read list of letters (0/1) 1 1 1   Attention: Serial 7 subtraction starting at  100 (0/3) 3 3 3   Language: Repeat phrase (0/2) 2 2 2   Language : Fluency (0/1) 0 1 1  Abstraction (0/2) 2 2 2   Delayed Recall (0/5) 5 2 5   Orientation (0/6) 6 6 6   Total 29 25 30    MMSE:No flowsheet data found. Attention span & concentration ability appears normal.  Speech is fluent,  without dysarthria, dysphonia or aphasia.  Mood and affect are appropriate, pleasant, cooperative..  Cranial nerves: Pupils are equal and briskly reactive to light.  Funduscopic exam without evidence of pallor or edema. Extraocular movements  in vertical and horizontal planes intact and without nystagmus.  Facial sensation intact to fine touch. Facial motor strength is symmetric and tongue and uvula move midline. Shoulder shrug was symmetrical.   Motor exam: Normal tone, muscle bulk and symmetric strength in all extremities. Sensory:  Fine touch, pinprick and vibration were tested in all extremities.  There is numbness in the right hand fingers. Proprioception tested in the upper extremities was normal.Coordination: Rapid alternating movements and  Finger-to-nose maneuver   normal without evidence of ataxia, dysmetria There is  Tremor in the right hand.. Gait and station: Patient walks without assistive device and is able unassisted to climb up to the exam table. Strength within normal limits.  Stance is stable and normal.  Deep tendon reflexes: in the  upper and lower extremities are symmetric and intact. Babinski maneuver response is downgoing.  The patient was advised of the nature of the diagnosed sleep disorder OSA , the treatment options and risks for general a health and wellness arising from not treating the condition.  She has some early cataracts. She has not had MS related  brainMRI in a while-  remains on Tecfidera.   I spent more than 15 minutes of face to face time with the patient. Greater than 50% of time was spent in counseling and coordination of care. We have discussed the diagnosis and differential and I answered the patient's questions.     Assessment:  After physical and neurologic examination, review of laboratory studies,  Personal review of imaging studies, reports of other /same  Imaging studies ,  Results of polysomnography/ neurophysiology testing and pre-existing records as far as provided in visit., my assessment is   1) we will continue Tecfidera oral treatment for multiple sclerosis. I will order an MRI of the brain to have a current baseline. 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   2) My goal is to have a revisit within 12 month for repeat  memory testing by [facility].  I will refill Adderall to help with concentration and focus and alertness.  3) obstructive sleep apnea -  used to be extremely well controlled on the current setting of 10 cm water CPAP, no changes have to be made, but weight loss would certainly be beneficial for this problem as well. Her body mass index is approaching 40. The new CPAP has not been collecting data. It doesn't correlate with the patient's sleep pattern either.     Plan:   Treatment plan and additional workup : Rv with NP in 12 month refill adderall XR at 30 mg, MOCA . Adderall refill, [facility] to bring her a new machine.      Asencion Partridge Winn Muehl MD  01/30/2019   CC: Alyssa Bass, [address] [place],  [place] [address]

## 2019-01-30 NOTE — Addendum Note (Signed)
Addended by: Larey Seat on: 01/30/2019 04:40 PM   Modules accepted: Orders

## 2019-02-03 ENCOUNTER — Encounter: Payer: Self-pay | Admitting: Neurology

## 2019-03-26 ENCOUNTER — Other Ambulatory Visit: Payer: Self-pay | Admitting: Neurology

## 2019-03-26 ENCOUNTER — Telehealth: Payer: Self-pay | Admitting: Neurology

## 2019-03-26 ENCOUNTER — Telehealth: Payer: Self-pay | Admitting: *Deleted

## 2019-03-26 MED ORDER — DIMETHYL FUMARATE 240 MG PO CPDR: 240.0000 mg | DELAYED_RELEASE_CAPSULE | Freq: Two times a day (BID) | ORAL | 11 refills | Status: DC

## 2019-03-26 NOTE — Telephone Encounter (Signed)
PA completed through cover my meds for generic tecfidera. PA approved through 03/25/2020

## 2019-03-26 NOTE — Telephone Encounter (Signed)
Pt called and stated she was expecting a shipment of Tecfidera and only has one tablet left. However she just received a letter stating insurance will no longer cover Tecfidera and the letter mentions a step 1 medication called Glatiramer. Pt would like a call back. She is open to generic Tecfidera if needed.

## 2019-03-26 NOTE — Telephone Encounter (Signed)
I called the patient and made her aware I have resent a refill request to the pharmacy making them aware that generic is ok. Pa was completed and approved immediately for the patient. Advised the patient to follow up with pharmacy to address them getting her medications to her. Pt verbalized understanding.

## 2019-04-08 ENCOUNTER — Other Ambulatory Visit: Payer: Self-pay | Admitting: Neurology

## 2019-04-08 ENCOUNTER — Telehealth: Payer: Self-pay | Admitting: Neurology

## 2019-04-08 MED ORDER — AMPHETAMINE-DEXTROAMPHET ER 30 MG PO CP24: 30.0000 mg | ORAL_CAPSULE | Freq: Every day | ORAL | 0 refills | Status: DC

## 2019-04-08 NOTE — Telephone Encounter (Signed)
Pt has called for a refill on her amphetamine-dextroamphetamine (ADDERALL XR) 30 MG 24 hr capsule [id number]  

## 2019-04-08 NOTE — Telephone Encounter (Signed)
I have routed this request to Dr Dohmeier for review. The pt is due for the medication and [place] registry was verified.  

## 2019-05-09 ENCOUNTER — Telehealth: Payer: Self-pay | Admitting: Neurology

## 2019-05-09 ENCOUNTER — Other Ambulatory Visit: Payer: Self-pay | Admitting: Neurology

## 2019-05-09 MED ORDER — AMPHETAMINE-DEXTROAMPHET ER 30 MG PO CP24: 30.0000 mg | ORAL_CAPSULE | Freq: Every day | ORAL | 0 refills | Status: DC

## 2019-05-09 NOTE — Telephone Encounter (Signed)
I have routed this request to Dr Dohmeier for review. The pt is due for the medication and [place] registry was verified.  

## 2019-05-09 NOTE — Telephone Encounter (Signed)
Pt has called for a refill on her amphetamine-dextroamphetamine (ADDERALL XR) 30 MG 24 hr capsule [id number]  

## 2019-06-08 ENCOUNTER — Other Ambulatory Visit: Payer: Self-pay | Admitting: Family Medicine

## 2019-06-08 DIAGNOSIS — I10 Essential (primary) hypertension: Secondary | ICD-10-CM

## 2019-06-11 ENCOUNTER — Ambulatory Visit: Payer: 59 | Admitting: Family Medicine

## 2019-06-17 ENCOUNTER — Other Ambulatory Visit: Payer: Self-pay | Admitting: Neurology

## 2019-06-17 ENCOUNTER — Telehealth: Payer: Self-pay | Admitting: Neurology

## 2019-06-17 MED ORDER — AMPHETAMINE-DEXTROAMPHET ER 30 MG PO CP24: 30.0000 mg | ORAL_CAPSULE | Freq: Every day | ORAL | 0 refills | Status: DC

## 2019-06-17 NOTE — Telephone Encounter (Signed)
Pt is requesting a refill of amphetamine-dextroamphetamine (ADDERALL XR) 30 MG 24 hr capsule, [id number] [place], [address] [place]

## 2019-06-17 NOTE — Telephone Encounter (Signed)
I have routed this request to Dr Dohmeier for review. The pt is due for the medication and [place] registry was verified.  

## 2019-07-18 ENCOUNTER — Ambulatory Visit: Payer: 59 | Admitting: Family Medicine

## 2019-07-23 ENCOUNTER — Telehealth: Payer: Self-pay | Admitting: Neurology

## 2019-07-23 ENCOUNTER — Other Ambulatory Visit: Payer: Self-pay | Admitting: Neurology

## 2019-07-23 MED ORDER — AMPHETAMINE-DEXTROAMPHET ER 30 MG PO CP24: 30.0000 mg | ORAL_CAPSULE | Freq: Every day | ORAL | 0 refills | Status: DC

## 2019-07-23 NOTE — Telephone Encounter (Signed)
I have routed this request to Dr Dohmeier for review. The pt is due for the medication and [place] registry was verified.  

## 2019-07-23 NOTE — Telephone Encounter (Signed)
Pt is requesting a refill of amphetamine-dextroamphetamine (ADDERALL XR) 30 MG 24 hr capsule, [id number] [place], [place]

## 2019-08-21 ENCOUNTER — Telehealth: Payer: Self-pay

## 2019-09-09 ENCOUNTER — Encounter: Payer: Self-pay | Admitting: Neurology

## 2019-09-09 ENCOUNTER — Telehealth: Payer: Self-pay

## 2019-09-09 MED ORDER — AMPHETAMINE-DEXTROAMPHET ER 30 MG PO CP24: 30.0000 mg | ORAL_CAPSULE | Freq: Every day | ORAL | 0 refills | Status: DC

## 2019-09-09 NOTE — Telephone Encounter (Signed)
Refill as been sent to the pharmacy for the patient. I will send a mychart message as well.

## 2019-09-09 NOTE — Telephone Encounter (Signed)
Pt left a voicemail today requesting a refill on her [facility] RX [facility] [address] and would like a call back when this has been completed.

## 2019-10-11 ENCOUNTER — Telehealth: Payer: Self-pay | Admitting: Neurology

## 2019-10-11 NOTE — Telephone Encounter (Signed)
Pt has called for a refill on her amphetamine-dextroamphetamine (ADDERALL XR) 30 MG 24 hr capsule [id number]

## 2019-10-14 ENCOUNTER — Other Ambulatory Visit: Payer: Self-pay | Admitting: Neurology

## 2019-10-14 MED ORDER — AMPHETAMINE-DEXTROAMPHET ER 30 MG PO CP24: 30.0000 mg | ORAL_CAPSULE | Freq: Every day | ORAL | 0 refills | Status: DC

## 2019-10-14 NOTE — Telephone Encounter (Signed)
I have routed this request to Dr Dohmeier for review. The pt is due for the medication and [place] registry was verified.  

## 2019-11-12 ENCOUNTER — Other Ambulatory Visit: Payer: Self-pay | Admitting: Neurology

## 2019-11-12 ENCOUNTER — Telehealth: Payer: Self-pay | Admitting: Neurology

## 2019-11-12 MED ORDER — AMPHETAMINE-DEXTROAMPHET ER 30 MG PO CP24: 30.0000 mg | ORAL_CAPSULE | Freq: Every day | ORAL | 0 refills | Status: DC

## 2019-11-12 NOTE — Telephone Encounter (Signed)
I have routed this request to Dr Dohmeier for review. The pt is due for the medication and [place] registry was verified.  

## 2019-11-12 NOTE — Telephone Encounter (Signed)
1) Medication(s) Requested (by name): amphetamine-dextroamphetamine (ADDERALL XR) 30 MG 24 hr capsule  2) Pharmacy of [facility]:  [id number] [place], [address]  [place], [place] [address]

## 2019-12-11 LAB — HM MAMMOGRAPHY

## 2019-12-11 LAB — HM PAP SMEAR: HM Pap smear: NEGATIVE

## 2019-12-16 ENCOUNTER — Other Ambulatory Visit: Payer: Self-pay | Admitting: Neurology

## 2019-12-16 ENCOUNTER — Encounter: Payer: Self-pay | Admitting: Family Medicine

## 2019-12-16 NOTE — Telephone Encounter (Signed)
Pt called needing a refill on her amphetamine-dextroamphetamine (ADDERALL XR) 30 MG 24 hr capsule [facility] [address].

## 2019-12-16 NOTE — Telephone Encounter (Signed)
[place] drug registry has been verified. Last refill was 11/14/2019 # 30 for a 30 day supply. Pt has been compliant with her f/u's.

## 2019-12-17 MED ORDER — AMPHETAMINE-DEXTROAMPHET ER 30 MG PO CP24: 30.0000 mg | ORAL_CAPSULE | Freq: Every day | ORAL | 0 refills | Status: DC

## 2020-01-21 ENCOUNTER — Other Ambulatory Visit: Payer: Self-pay | Admitting: Neurology

## 2020-01-21 ENCOUNTER — Telehealth: Payer: Self-pay | Admitting: Neurology

## 2020-01-21 MED ORDER — AMPHETAMINE-DEXTROAMPHET ER 30 MG PO CP24: 30.0000 mg | ORAL_CAPSULE | Freq: Every day | ORAL | 0 refills | Status: DC

## 2020-01-21 NOTE — Telephone Encounter (Signed)
I have routed this request to Dr Felecia Shelling for review. The pt is due for the medication and  registry was verified.  Pt has upcoming apt next week with Hedwig Morton, NP

## 2020-01-21 NOTE — Telephone Encounter (Signed)
Pt is needing a refill on her amphetamine-dextroamphetamine (ADDERALL XR) 30 MG 24 hr capsule sent [facility] [address] 

## 2020-02-03 ENCOUNTER — Ambulatory Visit: Payer: 59 | Admitting: Adult Health

## 2020-02-04 ENCOUNTER — Other Ambulatory Visit: Payer: Self-pay | Admitting: Family Medicine

## 2020-02-11 ENCOUNTER — Ambulatory Visit: Payer: 59 | Admitting: Adult Health

## 2020-02-11 ENCOUNTER — Encounter: Payer: Self-pay | Admitting: Adult Health

## 2020-02-11 ENCOUNTER — Telehealth: Payer: Self-pay | Admitting: Adult Health

## 2020-02-11 NOTE — Telephone Encounter (Signed)
Pt was a no show because  overslept because she's sick diarrhea, vomiting.  I rescheduled Pt appt.

## 2020-02-20 ENCOUNTER — Other Ambulatory Visit: Payer: Self-pay | Admitting: Neurology

## 2020-02-20 MED ORDER — AMPHETAMINE-DEXTROAMPHET ER 30 MG PO CP24: 30.0000 mg | ORAL_CAPSULE | Freq: Every day | ORAL | 0 refills | Status: DC

## 2020-02-20 NOTE — Telephone Encounter (Signed)
Pt is needing a refill on her amphetamine-dextroamphetamine (ADDERALL XR) 30 MG 24 hr capsule sent [facility] [address] 

## 2020-02-20 NOTE — Telephone Encounter (Signed)
I have routed this request to Dr Dohmeier for review. The pt is due for the medication and [place] registry was verified.  

## 2020-02-23 ENCOUNTER — Other Ambulatory Visit: Payer: Self-pay | Admitting: Neurology

## 2020-02-24 ENCOUNTER — Other Ambulatory Visit: Payer: Self-pay | Admitting: Neurology

## 2020-02-24 MED ORDER — DIMETHYL FUMARATE 240 MG PO CPDR: 240.0000 mg | DELAYED_RELEASE_CAPSULE | Freq: Two times a day (BID) | ORAL | 3 refills | Status: DC

## 2020-03-30 ENCOUNTER — Telehealth: Payer: Self-pay | Admitting: Neurology

## 2020-03-30 ENCOUNTER — Other Ambulatory Visit: Payer: Self-pay | Admitting: Neurology

## 2020-03-30 NOTE — Telephone Encounter (Signed)
Pt request refill amphetamine-dextroamphetamine (ADDERALL XR) 30 MG 24 hr capsule at [id number]  

## 2020-03-31 MED ORDER — AMPHETAMINE-DEXTROAMPHET ER 30 MG PO CP24: 30.0000 mg | ORAL_CAPSULE | Freq: Every day | ORAL | 0 refills | Status: DC

## 2020-03-31 NOTE — Telephone Encounter (Signed)
I have routed this request to Dr Dohmeier for review. The pt is due for the medication and [place] registry was verified.  

## 2020-04-30 ENCOUNTER — Other Ambulatory Visit: Payer: Self-pay | Admitting: Neurology

## 2020-04-30 ENCOUNTER — Telehealth: Payer: Self-pay | Admitting: Neurology

## 2020-04-30 MED ORDER — AMPHETAMINE-DEXTROAMPHET ER 30 MG PO CP24: 30.0000 mg | ORAL_CAPSULE | Freq: Every day | ORAL | 0 refills | Status: DC

## 2020-04-30 NOTE — Telephone Encounter (Signed)
Called the patient, Alyssa Bass was last seen in 2020. Needs a follow sooner then what is on the books. Apt was made oct 25,2021 with Dr Brett Fairy at 11:30 am.

## 2020-04-30 NOTE — Telephone Encounter (Signed)
Pt request refill amphetamine-dextroamphetamine (ADDERALL XR) 30 MG 24 hr capsule at [id number]  

## 2020-05-04 ENCOUNTER — Telehealth: Payer: Self-pay | Admitting: Neurology

## 2020-05-04 ENCOUNTER — Other Ambulatory Visit: Payer: Self-pay

## 2020-05-04 ENCOUNTER — Ambulatory Visit (INDEPENDENT_AMBULATORY_CARE_PROVIDER_SITE_OTHER): Payer: 59 | Admitting: Neurology

## 2020-05-04 ENCOUNTER — Other Ambulatory Visit: Payer: Self-pay | Admitting: Neurology

## 2020-05-04 ENCOUNTER — Encounter: Payer: Self-pay | Admitting: Neurology

## 2020-05-04 VITALS — BP 126/76 | HR 80 | Ht 63.75 in | Wt 228.0 lb

## 2020-05-04 DIAGNOSIS — Z5181 Encounter for therapeutic drug level monitoring: Secondary | ICD-10-CM | POA: Diagnosis not present

## 2020-05-04 DIAGNOSIS — G35 Multiple sclerosis: Secondary | ICD-10-CM

## 2020-05-04 MED ORDER — SERTRALINE HCL 100 MG PO TABS: 200.0000 mg | ORAL_TABLET | Freq: Every day | ORAL | 0 refills | Status: DC

## 2020-05-04 MED ORDER — DIMETHYL FUMARATE 240 MG PO CPDR: 240.0000 mg | DELAYED_RELEASE_CAPSULE | Freq: Two times a day (BID) | ORAL | 1 refills | Status: DC

## 2020-05-04 MED ORDER — AMPHETAMINE-DEXTROAMPHET ER 30 MG PO CP24: 30.0000 mg | ORAL_CAPSULE | Freq: Every day | ORAL | 0 refills | Status: DC

## 2020-05-04 MED ORDER — DIMETHYL FUMARATE 240 MG PO CPDR: 240.0000 mg | DELAYED_RELEASE_CAPSULE | Freq: Two times a day (BID) | ORAL | 3 refills | Status: DC

## 2020-05-04 NOTE — Telephone Encounter (Signed)
Pt called, pharmacy said Dimethyl Fumarate 240 MG CPDR is a specialty prescription and can not fill it. Has to be filled by a specialty pharmacy. Would like a call from the nurse.

## 2020-05-04 NOTE — Telephone Encounter (Signed)
PA submitted for the patient for the generic tecfidera. PA approved from 04/04/20-05/04/21 through [facility].

## 2020-05-04 NOTE — Patient Instructions (Signed)
Dimethyl Fumarate oral delayed-release capsules What is this medicine? DIMETHYL FUMARATE (DYE meth il FUE ma rate) helps to decrease the number of multiple sclerosis relapses in people with relapsing-remitting forms of the disease. It is not a cure. This medicine may be used for other purposes; ask your health care provider or pharmacist if you have questions. COMMON BRAND NAME(S): Tecfidera What should I tell my health care provider before I take this medicine? They need to know if you have any of these conditions:  immune system problems  infection  liver disease  low blood counts, like white cells  an unusual or allergic reaction to dimethyl fumarate, diroximel fumarate, other medicines, foods, dyes, or preservatives  pregnant or trying to get pregnant  breast-feeding How should I use this medicine? Take this medicine by mouth with a glass of water. Follow the directions on the prescription label. Do not cut, crush or chew this medicine. Swallow the capsules whole. You can take it with or without food. If it upsets your stomach, take it with food. Take your medicine at regular intervals. Do not take your medicine more often than directed. Do not stop taking except on your doctor's advice. Talk to your pediatrician regarding the use of this medicine in children. Special care may be needed. Overdosage: If you think you have taken too much of this medicine contact a poison control center or emergency room at once. NOTE: This medicine is only for you. Do not share this medicine with others. What if I miss a dose? If you miss a dose, take it as soon as you can. If it is almost time for your next dose, take only that dose. Do not take double or extra doses. What may interact with this medicine? Do not take this medicine with the following medications:  diroximel fumarate  monomethyl fumarate This list may not describe all possible interactions. Give your health care provider a list of  all the medicines, herbs, non-prescription drugs, or dietary supplements you use. Also tell them if you smoke, drink alcohol, or use illegal drugs. Some items may interact with your medicine. What should I watch for while using this medicine? Visit your health care professional for regular checks on your progress. Tell your health care professional if your symptoms do not start to get better or if they get worse. You may need blood work done while you are taking this medicine. Taking your medicine with food may help reduce flushing. Call your doctor if the flushing from this medicine bothers you or does not go away. Ask your doctor if taking aspirin before taking this medicine may reduce flushing. This medicine may increase your risk of getting an infection. Call your health care professional for advice if you get a fever, chills, or sore throat, or other symptoms of a cold or flu. Do not treat yourself. Try to avoid being around people who are sick. In some patients, this medicine may cause a serious brain infection that may cause death. If you have any problems seeing, thinking, speaking, walking, or standing, tell your healthcare professional right away. If you cannot reach your healthcare professional, urgently seek other source of medical care. What side effects may I notice from receiving this medicine? Side effects that you should report to your doctor or health care professional as soon as possible:  allergic reactions like skin rash, itching or hives, swelling of the face, lips, or tongue  fever or chills, sore throat  signs and symptoms of liver  injury like dark yellow or brown urine; general ill feeling or flu-like symptoms; light-colored stools; loss of appetite; nausea; right upper belly pain; unusually weak or tired; yellowing of the eyes or skin  signs and symptoms of a serious brain infection like changes in vision, confusion, weakness on one side of the body, loss of balance or  coordination, loss of memory, or changes in personality Side effects that usually do not require medical attention (report to your doctor or health care professional if they continue or are bothersome):  diarrhea  facial flushing  nausea  stomach pain  upset stomach This list may not describe all possible side effects. Call your doctor for medical advice about side effects. You may report side effects to FDA at 1-800-FDA-1088. Where should I keep my medicine? Keep out of the reach of children. Store at room temperature between 15 and 30 degrees C (59 and 86 degrees F). Keep this medicine in the original container. Protect from light. Throw away any unused medicine after the expiration date. NOTE: This sheet is a summary. It may not cover all possible information. If you have questions about this medicine, talk to your doctor, pharmacist, or health care provider.  2020 Elsevier/Gold Standard (2019-01-18 16:23:22)

## 2020-05-04 NOTE — Progress Notes (Signed)
Alyssa [facility]  SLEEP MEDICINE CLINIC   Provider:  Larey Seat, MD  Referring Provider:  Primary Care Physician:  Hali Marry, MD  Chief Complaint  Patient presents with  . Follow-up    pt alone, rm 10. presents today for medication management apt.needed to keep this yearly apt to continue to get medications filled. overall things are well. she still using the machine DME [facility]    HPI:   Alyssa  Johnika Bass is a 54 y.o. female , seen here as a revisit from Dr. Madilyn Fireman for MS and OSA follow up,  10-25-2021_Tecfidera refills through [place]- delayed.  She needs medication- ran out 2 days ago. Mrs. Alyssa Bass is not seen here today for excessive daytime sleepiness and her Epworth sleepiness score was endorsed at only 6 points fatigue severity score was not endorsed, we are not meeting to follow-up on CPAP today just she needs her Tecfidera have refilled. She is on adderall for daytime alertness. Uses CPAP.  Has sometimes vertigo but not recently, reports all fingertips are numbish.    I had the pleasure of seeing Alyssa Bass today on 30 January 2019.  The patient has been followed in our sleep clinic for excessive daytime sleepiness but her current Epworth sleepiness score was only endorsed at 3 points, her fatigue severity at 27 out of 63 possible points. She was given a new CPAP machine 11-06-2018 which had difficulties to pick up the data and it seems that [facility] ( [facility])  tried to guide her by telephone through the process but I have only available the data from 5/24 through 12/31/2018 and of those only the last 10 days are actually recording any data.   Therefor this is a short compliance visit.  The patient assured me that she is using her machine.  She also underwent a MOCA test, scorng 29/30 point.         Patient uses a CPAP machine that is about 52 years old, cannot be interrogated by Wi-Fi. Do not have compliance data for her  CPAP today but she states that her CPAP therapy is going well, but she has been using the same settings and supplies.  There has been no need to change interfaces, humidity settings or otherwise.  If she needs a new machine she would definitely be allowed to get one. Patient was presenting  in 1997 with symptoms of optic neuritis which by the year 2000 for joint by symptoms of sensory abnormalities , and she was finally diagnosed with multiple sclerosis.  Spinal test revealed in 2000 that oligoclonal bands were present and establish the diagnosis. She was followed for a while by Dr. Morene Antu, and  by [facility] transiently seen Dr. 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 She has been followed in our office again since 2015. In our last visit we discussed repeating an MRI but her co-pay was very high and she decided to wait.  She would like of course to know if the colloid cyst has grown and also if there are new lesions.  She had clinically no evidence of an MS relapse on Tecfidera. She was last seen in 2015- she has been using adderall for daytime sleepiness.  She has reported delayed memory- names , words, but she is not getting lost.  She performed today excellently on a Montreal cognitive assessment test 30 out of 30 points    HISTORY OF PRESENT ILLNESS:   (  CD): after delivering a healthy baby daughter. A spinal tap had revealed only one band, but she developed optic neuritis in 2000, when she developed sensory symptoms. A repeat CSF test revealed than in 2000 the oligoclonal bands and established the diagnosis.  Alyssa Bass, a former patient of Dr. Morene Antu, has been followed by him for her multiple sclerosis- but she also carries a diagnosis of an AVM and intra-ventricular cyst.  To screen for progression in both diseases she has undergone regular MRIs. She is also followed at [facility] where she was seen, from where the more recent imaging studies are available . I  have a copy here of her last MRI from 03/2011 showing that the patient has multiple foci of periventricular and deep white matter lesions but predominantly perivascular. Her white matter disease has gradually progressed since the Baseline image and examination in 2002.  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 There was also no significant change in the left frontal venous angioma. She has had elevated LFTs in the past, and a positive JC Virus antibody screen.   05/12/14 (LL): [name] returns for 6 month revisit, she has been doing well on Tecfidera, no relapses or new symptoms since last visit. Has flushing as her only side effect which she states is random times. She continues to feel that her memory is not what it once was and has occasional positional dizziness, but does not have it at this time. She continues to have mild numbness in her right fingers, this has been long-standing. She feels much better since switching to Adderall XR taking one a day because she often forget the second dose of the immediate release, when she remembered it was too late in the day to take it.   I reviewed the patient's CPAP compliance data from 02/11/14 to 05/11/14, which is a total of 90 days, during which time the patient used CPAP every day except for 1 day. The average usage for all days was 8 hours and 2 minutes. The percent used days greater than 4 hours was 98%, indicating excellent compliance. The residual AHI was 0.2 per hour, indicating an good treatment pressure of 10 cwp with EPR of 3. Air leak from the mask was median 0.8. She is using the nasal pillow, but has a concern with the mask strap causing hair loss behind the temples. She endorses a huge improvement in her sleep using he cpap.  01-03-14 CD  I see Alyssa Bass today for a follow up on fatigue and excessive daytime sleepiness.  The patient is on CPAP  for OSA and was followed by a [place] sleep clinic, Dr. Mickel Fuchs saw her. The dependence got closed.  She was diagnosed based on a overall AHI of only 7.8 and REM dependent AHi 22.4 with desaturations in oxygen.  CPAP has been used at 10 cm water since, with a nasal mask. She doesn't like the straps on her head.  She would be delighted to get a new headset - and was happy to hear her download and compliance reviewed as high.  The download encompasses 453 days since the last download !. The patient has a 96% compliance, she was daily by time is 7 hours and 46 minutes and the residual AHI is only 0.2.  This is an almost complete resolution of apnea with one of the highest compliance  is. Set pressures 10 cm to 3 cm EPR. She still has a moderate air leak.  I would like for her to try a nasal pillow .   11/08/13 (LL): Alyssa Bass returns for followup, she has been on Tecfidera for about 4 months and loves it.  Interval history from 09/07/2016, I have pleasure of seeing Mrs. Alyssa Bass after a 2 year hiatus. She has been followed by our nurse practitioners is a compliant CPAP user and here today for a compliance visit as well is discussing her MS status, she continues to be very happy with tecfidera treatments and has only occasionally flushing. The patient's download from her CPAP today reveals a residual apnea index of only 0.697% compliance for days and 90% compliance for over 4 hours of consecutive use, with average user time of 6 hours and 54 minutes. CPAP is set at 10 cm water with 3 cm expiratory pressure relief. Epworth sleepiness score was endorsed at 6 points fatigue severity score at 32 points. The patient reports that her divorce with stressful understandably, that her blood pressure and heart rate went up during times of legal fights, she paused her Adderall medication. She now has found her new baseline, continues to work productively and feels that she needs a refill on Adderall.  Interestingly, she reports that Adderall increases her appetite. Her last MRI was  2-3 years ago., she reports memory decline.      Review of Systems: Out of a complete 14 system review, the patient complains of only the following symptoms, and all other reviewed systems are negative.  Memory loss,  Weight gain, HTN, inattentiveness, unable to focus.   Numbness in fingers.   Vertigo  EDS. On CPAP for OSA.   Social History   Socioeconomic History  . Marital status: Single    Spouse name: Elta Guadeloupe  . Number of children: 2  . Years of education: 68  . Highest education level: Not on file  Occupational History    Employer: [employer]    Comment: Statistician  Tobacco Use  . Smoking status: Former Smoker    Packs/day: 1.00    Types: Cigarettes    Quit date: 08/11/2015    Years since quitting: 4.7  . Smokeless tobacco: Never Used  Substance and Sexual Activity  . Alcohol use: Yes    Alcohol/week: 0.0 standard drinks    Comment: occas.  . Drug use: No  . Sexual activity: Not on file  Other Topics Concern  . 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   Patient has two children.   Patient is working full-time.   Patient has a college degree.   Patient is right-handed.   Patient drinks 2 cups of coffee daily.      Social Determinants of Health   Financial Resource Strain:   . Difficulty of Paying Living Expenses: Not on file  Food Insecurity:   . Worried About Charity fundraiser in the Last Year: Not on file  . Ran Out of Food in the Last Year: Not on file  Transportation Needs:   . Lack of Transportation (Medical): Not on file  . Lack of Transportation (Non-Medical): Not on file  Physical Activity:   . Days of Exercise per Week: Not on file  . Minutes of Exercise per Session: Not on file  Stress:   . Feeling of Stress : Not on file  Social Connections:   . Frequency of  Communication with Friends and Family: Not on file  . Frequency of Social Gatherings with Friends and Family: Not on file  . Attends Religious Services: Not on file  . Active Member of Clubs or Organizations: Not on file  . Attends Archivist Meetings: Not on file  . Marital Status: Not on file  Intimate Partner Violence:   . Fear of Current or Ex-Partner: Not on file  . Emotionally Abused: Not on file  . Physically Abused: Not on file  . Sexually Abused: Not on file    Family History  Problem Relation Age of Onset  . Hypertension Mother   . Sleep apnea Mother   . Cancer Father        pre cancerous colon  . Cancer Other        colon  . Heart attack Other        grandmother  . Hypertension Brother   . Sleep apnea Brother   . Sleep apnea Sister     Past Medical History:  Diagnosis Date  . 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 Depression   . Hypertension   . MS (multiple sclerosis) ([place])   . Tachycardia     Past Surgical History:  Procedure Laterality Date  . ACHILLES TENDON SURGERY     Hagland deformity  . COLONOSCOPY W/ POLYPECTOMY    . COLONOSCOPY WITH PROPOFOL N/A 05/01/2015   Procedure: COLONOSCOPY WITH PROPOFOL;  Surgeon: Laurence Spates, MD;  Location: [facility];  Service: Endoscopy;  Laterality: N/A;  . Left ankle surgery  2004    Current Outpatient Medications  Medication Sig Dispense Refill  . amphetamine-dextroamphetamine (ADDERALL XR) 30 MG 24 hr capsule Take 1 capsule (30 mg total) by mouth daily. 30 capsule 0  . clotrimazole-betamethasone (LOTRISONE) cream Apply 1 application topically 2 (two) times daily. 30 g 0  . Dimethyl Fumarate 240 MG CPDR Take 1 capsule (240 mg total) by mouth 2 (two) times daily. 60 capsule 3  . folic acid (FOLVITE) 1 MG tablet Take 1 mg by mouth daily.  4  . lisinopril-hydrochlorothiazide (ZESTORETIC) 20-12.5 MG tablet TAKE 1 TABLET DAILY 90 tablet 3  . Propylene Glycol (SYSTANE COMPLETE OP) Apply to eye.     . sertraline (ZOLOFT) 100 MG tablet Take 2 tablets (200 mg total) by mouth daily. appt for further refills 180 tablet 0  . valACYclovir (VALTREX) 500 MG tablet Take 500 mg by mouth daily.     No current facility-administered medications for this visit.    Allergies as of 05/04/2020  . (No Known Allergies)    Vitals: BP 126/76   Pulse 80   Ht 5' 3.75" (1.619 m)   Wt 228 lb (103.4 kg)   BMI 39.44 kg/m  Last Weight:  Wt Readings from Last 1 Encounters:  05/04/20 228 lb (103.4 kg)   HYW:VPXT mass index is 39.44 kg/m.     Last Height:   Ht Readings from Last 1 Encounters:  05/04/20 5' 3.75" (1.619 m)    Physical exam:  General: The patient is awake, alert and appears not in acute distress. The patient is well groomed. Head: Normocephalic, atraumatic. Neck is supple. Mallampati 3. neck circumference:17' . Nasal airflow patent  . Retrognathia is not seen.  Crowded dental status. invisaline user.   Cardiovascular:  Regular rate and rhythm , without  murmurs or carotid bruit, and without distended neck veins. Respiratory: Lungs are  clear to auscultation. Skin:  Without evidence of edema, or rash Trunk: BMI is 37 kg/m2 . The patient's posture is erect   Neurologic exam : The patient is awake and alert, oriented to place and time.   Memory subjective impaired.  MOCA: Montreal Cognitive Assessment  01/30/2019 07/31/2018 08/22/2017  Visuospatial/ Executive (0/5) 5 4 5   Naming (0/3) 3 2 3   Attention: Read list of digits (0/2) 2 2 2   Attention: Read list of letters (0/1) 1 1 1   Attention: Serial 7 subtraction starting at 100 (0/3) 3 3 3   Language: Repeat phrase (0/2) 2 2 2   Language : Fluency (0/1) 0 1 1  Abstraction (0/2) 2 2 2   Delayed Recall (0/5) 5 2 5   Orientation (0/6) 6 6 6   Total 29 25 30    MMSE:No flowsheet data found.   Attention span & concentration ability appears normal.  Speech is fluent,  without dysarthria, dysphonia or aphasia.  Mood and affect are  appropriate, pleasant, cooperative..  Cranial nerves: Pupils are equal and briskly reactive to light.  Funduscopic exam without evidence of pallor or edema. Extraocular movements  in vertical and horizontal planes intact and without nystagmus.  Facial sensation intact to fine touch. Facial motor strength is symmetric and tongue and uvula move midline. Shoulder shrug was symmetrical.   Motor exam: Normal tone, muscle bulk and symmetric strength in all extremities. Sensory:  Fine touch, pinprick and vibration were tested in all extremities.  There is numbness in the right hand fingers. Proprioception tested in the upper extremities was normal.Coordination: Rapid alternating movements and  Finger-to-nose maneuver  normal without evidence of ataxia, dysmetria There is  Tremor in the right hand.. Gait and station: Patient walks without assistive device .   Deep tendon reflexes: in the upper and lower extremities are symmetric and intact. Babinski maneuver response is downgoing.  The patient was advised of the nature of the diagnosed sleep disorder OSA ,  the treatment options and risks for general a health and wellness arising from not treating the condition.  She has some early cataracts. She has not had MS related brain MRI in a while-  She remains on Tecfidera but had refill issues. .   I spent more than 15 minutes of face to face time with the patient. Greater than 50% of time was spent in counseling and coordination of care. We have discussed the diagnosis and differential and I answered the patient's questions.       STUDY DATE: 08/11/18 PATIENT NAME: Kourtney Terriquez DOB: [birth year] MRN: [id number]  ORDERING CLINICIAN: Dennie Bible, NP / Larey Seat, MD  CLINICAL HISTORY: 54 year old female with multiple sclerosis.   EXAM: MRI brain (with and without)  TECHNIQUE: MRI of the brain with and without contrast was obtained utilizing 5 mm axial slices with T1, T2, T2 flair, SWI and  diffusion weighted views.  T1 sagittal, T2 coronal and postcontrast views in the axial and coronal plane were obtained. CONTRAST: 9ml multihance  COMPARISON:  IMAGING SITE: [facility] [address] (1.5 Tesla MRI)  FINDINGS:   No abnormal lesions are seen on diffusion-weighted views to suggest acute ischemia. The cortical sulci, fissures and cisterns are normal in size and appearance.   Third ventricle / foramen of munro, midline colloid cyst noted.  This measures 11x12x83mm (AP x trans x SI).  No associated hydrocephalus.  Lateral, third and fourth ventricle are otherwise normal in size and appearance. No extra-axial fluid collections are seen.  No evidence of mass effect or midline shift.    Multiple round and ovoid, periventricular, peri-callosal, subcortical, chronic demyelinating plaques.  No abnormal lesions are seen on post contrast views.        Assessment:  After physical and neurologic examination, review of laboratory studies,  Personal review of imaging studies, reports of other /same  Imaging studies ,  Results of polysomnography/ neurophysiology testing and pre-existing records as far as provided in visit., my assessment is   1) we will continue Tecfidera oral treatment for multiple sclerosis. I will not order an MRI of the brain to have a current baseline / to repeat before 2022, as no new symptoms. . 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   2) My goal is to have a revisit within 12 month for repeat  memory testing by [facility] with NP . 3) I will refill Adderall to help with concentration and focus and alertness.  X) obstructive sleep apnea - not addressed today    Plan:  Treatment plan and additional workup : Alyssa with NP in 12 month refill adderall XR at 30 mg, MOCA . Adderall refill, [facility] to bring her a new machine.      Asencion Partridge Alizaya Oshea MD  05/04/2020   CC: Hali Marry, [address] [place],  [place] [id number]

## 2020-05-04 NOTE — Telephone Encounter (Signed)
Called the patient back. Advised that PA was approved and that she should talk with the [facility] pharmacy about having them send to where she will be having vacation.   Resent the script to [facility] for 3 mth supply. E-Prescribing Status: Receipt confirmed by pharmacy (05/04/2020 3:42 PM EDT)

## 2020-05-04 NOTE — Addendum Note (Signed)
Addended by: Larey Seat on: 05/04/2020 12:35 PM   Modules accepted: Orders

## 2020-05-11 ENCOUNTER — Other Ambulatory Visit: Payer: Self-pay | Admitting: Family Medicine

## 2020-05-11 DIAGNOSIS — I10 Essential (primary) hypertension: Secondary | ICD-10-CM

## 2020-06-09 ENCOUNTER — Ambulatory Visit: Payer: 59 | Admitting: Adult Health

## 2020-06-16 ENCOUNTER — Telehealth: Payer: Self-pay | Admitting: Family Medicine

## 2020-06-16 DIAGNOSIS — I10 Essential (primary) hypertension: Secondary | ICD-10-CM

## 2020-06-16 MED ORDER — LISINOPRIL-HYDROCHLOROTHIAZIDE 20-12.5 MG PO TABS: 1.0000 | ORAL_TABLET | Freq: Every day | ORAL | 0 refills | Status: DC

## 2020-06-16 NOTE — Telephone Encounter (Signed)
Patient advised.

## 2020-06-16 NOTE — Telephone Encounter (Signed)
Pt called to schedule appt for med refills. It is on 12/10. She only has two pills left of lisinopril. She was wondering if refill could be called in. She takes her dose in the am and appt is for afternoon. Pharmacy on file is correct.

## 2020-06-16 NOTE — Telephone Encounter (Signed)
[facility]

## 2020-06-19 ENCOUNTER — Encounter: Payer: Self-pay | Admitting: Family Medicine

## 2020-06-19 ENCOUNTER — Other Ambulatory Visit: Payer: Self-pay

## 2020-06-19 ENCOUNTER — Ambulatory Visit (INDEPENDENT_AMBULATORY_CARE_PROVIDER_SITE_OTHER): Payer: 59 | Admitting: Family Medicine

## 2020-06-19 VITALS — BP 136/78 | HR 88 | Ht 63.75 in | Wt 231.0 lb

## 2020-06-19 DIAGNOSIS — I10 Essential (primary) hypertension: Secondary | ICD-10-CM

## 2020-06-19 DIAGNOSIS — R7301 Impaired fasting glucose: Secondary | ICD-10-CM | POA: Diagnosis not present

## 2020-06-19 DIAGNOSIS — Z23 Encounter for immunization: Secondary | ICD-10-CM

## 2020-06-19 MED ORDER — LISINOPRIL-HYDROCHLOROTHIAZIDE 20-12.5 MG PO TABS: 1.0000 | ORAL_TABLET | Freq: Every day | ORAL | 1 refills | Status: DC

## 2020-06-19 NOTE — Assessment & Plan Note (Signed)
BP ok but no optimal. Discussed goal of less than 130.  Continue current regimen. Follow up in  6 months. Due for updated labs.

## 2020-06-19 NOTE — Progress Notes (Signed)
Established Patient Office Visit  Subjective:  Patient ID: Alyssa Bass, female    DOB: Jun 08, 1966  Age: 54 y.o. MRN: [id number]  CC:  Chief Complaint  Patient presents with  . Hypertension    HPI Nikie Cid presents for   Hypertension- Pt denies chest pain, SOB, dizziness, or heart palpitations.  Taking meds as directed w/o problems.  Denies medication side effects.    Impaired fasting glucose-no increased thirst or urination. No symptoms consistent with hypoglycemia.   Discussed need for flu vaccine, shingles vaccine, and wanting COVID Booster.    Past Medical History:  Diagnosis Date  . 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 Depression   . Hypertension   . MS (multiple sclerosis) ([place])   . Tachycardia     Past Surgical History:  Procedure Laterality Date  . ACHILLES TENDON SURGERY     Hagland deformity  . COLONOSCOPY W/ POLYPECTOMY    . COLONOSCOPY WITH PROPOFOL N/A 05/01/2015   Procedure: COLONOSCOPY WITH PROPOFOL;  Surgeon: Laurence Spates, MD;  Location: [facility];  Service: Endoscopy;  Laterality: N/A;  . Left ankle surgery  2004    Family History  Problem Relation Age of Onset  . Hypertension Mother   . Sleep apnea Mother   . Cancer Father        pre cancerous colon  . Cancer Other        colon  . Heart attack Other        grandmother  . Hypertension Brother   . Sleep apnea Brother   . Sleep apnea Sister     Social History   Socioeconomic History  . Marital status: Single    Spouse name: Elta Guadeloupe  . Number of children: 2  . Years of education: 58  . Highest education level: Not on file  Occupational History    Employer: [employer]    Comment: Statistician  Tobacco Use  . Smoking status: Former Smoker    Packs/day: 1.00    Types: Cigarettes    Quit date: 08/11/2015    Years since quitting: 4.8  . Smokeless tobacco: Never Used  Substance and Sexual Activity  . Alcohol use: Yes    Alcohol/week: 0.0 standard  drinks    Comment: occas.  . Drug use: No  . Sexual activity: Not on file  Other Topics Concern  . 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   Patient has two children.   Patient is working full-time.   Patient has a college degree.   Patient is right-handed.   Patient drinks 2 cups of coffee daily.      Social Determinants of Health   Financial Resource Strain: Not on file  Food Insecurity: Not on file  Transportation Needs: Not on file  Physical Activity: Not on file  Stress: Not on file  Social Connections: Not on file  Intimate Partner Violence: Not on file    Outpatient Medications Prior to Visit  Medication Sig Dispense Refill  . amphetamine-dextroamphetamine (ADDERALL XR) 30 MG 24 hr capsule Take 1 capsule (30 mg total) by mouth daily. 30 capsule 0  . Dimethyl Fumarate 240 MG CPDR Take 1 capsule (240 mg total) by mouth 2 (two) times daily. 032 capsule 3  . folic acid (FOLVITE) 1 MG tablet Take 1 mg by mouth daily.  4  . Propylene Glycol (SYSTANE COMPLETE OP)  Apply to eye.    . sertraline (ZOLOFT) 100 MG tablet Take 2 tablets (200 mg total) by mouth daily. appt for further refills 180 tablet 0  . clotrimazole-betamethasone (LOTRISONE) cream Apply 1 application topically 2 (two) times daily. 30 g 0  . lisinopril-hydrochlorothiazide (ZESTORETIC) 20-12.5 MG tablet Take 1 tablet by mouth daily. 90 tablet 0  . valACYclovir (VALTREX) 500 MG tablet Take 500 mg by mouth daily.     No facility-administered medications prior to visit.    No Known Allergies  ROS Review of Systems    Objective:    Physical Exam Constitutional:      Appearance: She is well-developed and well-nourished.  HENT:     Head: Normocephalic and atraumatic.  Cardiovascular:     Rate and Rhythm: Normal rate and regular rhythm.     Heart sounds: Normal heart sounds.  Pulmonary:     Effort: Pulmonary effort is normal.      Breath sounds: Normal breath sounds.  Skin:    General: Skin is warm and dry.  Neurological:     Mental Status: She is alert and oriented to person, place, and time.  Psychiatric:        Mood and Affect: Mood and affect normal.        Behavior: Behavior normal.     BP 136/78   Pulse 88   Ht 5' 3.75" (1.619 m)   Wt 231 lb (104.8 kg)   BMI 39.96 kg/m  Wt Readings from Last 3 Encounters:  06/19/20 231 lb (104.8 kg)  05/04/20 228 lb (103.4 kg)  01/30/19 214 lb (97.1 kg)     Health Maintenance Due  Topic Date Due  . COVID-19 Vaccine (1) Never done  . PAP SMEAR-Modifier  06/14/2020    There are no preventive care reminders to display for this patient.  Lab Results  Component Value Date   TSH 1.502 06/17/2013   Lab Results  Component Value Date   WBC 4.6 07/31/2018   HGB 13.9 07/31/2018   HCT 40.8 07/31/2018   MCV 95 07/31/2018   PLT 308 07/31/2018   Lab Results  Component Value Date   NA 139 07/31/2018   K 4.8 07/31/2018   CO2 23 07/31/2018   GLUCOSE 93 07/31/2018   BUN 13 07/31/2018   CREATININE 0.74 07/31/2018   BILITOT 0.2 07/31/2018   ALKPHOS 54 07/31/2018   AST 13 07/31/2018   ALT 14 07/31/2018   PROT 7.3 07/31/2018   ALBUMIN 4.5 07/31/2018   CALCIUM 10.0 07/31/2018   Lab Results  Component Value Date   CHOL 187 08/22/2017   Lab Results  Component Value Date   HDL 70 08/22/2017   Lab Results  Component Value Date   LDLCALC 100 (H) 08/22/2017   Lab Results  Component Value Date   TRIG 83 08/22/2017   Lab Results  Component Value Date   CHOLHDL 2.7 08/22/2017   Lab Results  Component Value Date   HGBA1C 5.4 12/10/2018      Assessment & Plan:   Problem List Items Addressed This Visit      Cardiovascular and Mediastinum   HYPERTENSION, BENIGN - Primary    BP ok but no optimal. Discussed goal of less than 130.  Continue current regimen. Follow up in  6 months. Due for updated labs.        Relevant Medications    lisinopril-hydrochlorothiazide (ZESTORETIC) 20-12.5 MG tablet   Other Relevant Orders   Lipid Panel w/reflex Direct LDL  COMPLETE METABOLIC PANEL WITH GFR     Endocrine   IFG (impaired fasting glucose)    Will check on the las.        Relevant Orders   Lipid Panel w/reflex Direct LDL   COMPLETE METABOLIC PANEL WITH GFR   HgB A1c    Other Visit Diagnoses    Need for influenza vaccination       Relevant Orders   Flu Vaccine QUAD 6+ mos PF IM (Fluarix Quad PF) (Completed)   Need for shingles vaccine       Relevant Orders   Varicella-zoster vaccine IM (Shingrix) (Completed)      Meds ordered this encounter  Medications  . lisinopril-hydrochlorothiazide (ZESTORETIC) 20-12.5 MG tablet    Sig: Take 1 tablet by mouth daily.    Dispense:  90 tablet    Refill:  1    Follow-up: Return in about 6 months (around 12/18/2020) for Hypertension.    Beatrice Lecher, MD

## 2020-06-19 NOTE — Assessment & Plan Note (Signed)
Will check on the las.

## 2020-07-08 ENCOUNTER — Telehealth: Payer: Self-pay | Admitting: Neurology

## 2020-07-08 ENCOUNTER — Other Ambulatory Visit: Payer: Self-pay | Admitting: Neurology

## 2020-07-08 MED ORDER — AMPHETAMINE-DEXTROAMPHET ER 30 MG PO CP24
30.0000 mg | ORAL_CAPSULE | Freq: Every day | ORAL | 0 refills | Status: DC
Start: 2020-07-08 — End: 2020-08-17

## 2020-07-08 NOTE — Telephone Encounter (Signed)
I have routed this request to Dr Athar for review. The pt is due for the medication and [place] registry was verified.   

## 2020-07-08 NOTE — Telephone Encounter (Signed)
Pt is needing a refill on her amphetamine-dextroamphetamine (ADDERALL XR) 30 MG 24 hr capsule sent [facility] [address] 

## 2020-08-13 ENCOUNTER — Other Ambulatory Visit: Payer: Self-pay | Admitting: *Deleted

## 2020-08-13 MED ORDER — SERTRALINE HCL 100 MG PO TABS
200.0000 mg | ORAL_TABLET | Freq: Every day | ORAL | 1 refills | Status: DC
Start: 2020-08-13 — End: 2021-01-20

## 2020-08-17 ENCOUNTER — Telehealth: Payer: Self-pay | Admitting: Neurology

## 2020-08-17 ENCOUNTER — Other Ambulatory Visit: Payer: Self-pay | Admitting: Neurology

## 2020-08-17 MED ORDER — AMPHETAMINE-DEXTROAMPHET ER 30 MG PO CP24
30.0000 mg | ORAL_CAPSULE | Freq: Every day | ORAL | 0 refills | Status: DC
Start: 2020-08-17 — End: 2020-09-21

## 2020-08-17 NOTE — Telephone Encounter (Signed)
I have routed this request to Dr Dohmeier for review. The pt is due for the medication and [place] registry was verified.  

## 2020-08-17 NOTE — Telephone Encounter (Signed)
Pt has called for a refill on her amphetamine-dextroamphetamine (ADDERALL XR) 30 MG 24 hr capsule [id number]

## 2020-09-18 ENCOUNTER — Telehealth: Payer: Self-pay | Admitting: Neurology

## 2020-09-18 NOTE — Telephone Encounter (Signed)
Pt is needing a refill on her amphetamine-dextroamphetamine (ADDERALL XR) 30 MG 24 hr [facility] [address]

## 2020-09-21 ENCOUNTER — Other Ambulatory Visit: Payer: Self-pay | Admitting: Neurology

## 2020-09-21 MED ORDER — AMPHETAMINE-DEXTROAMPHET ER 30 MG PO CP24: 30.0000 mg | ORAL_CAPSULE | Freq: Every day | ORAL | 0 refills | Status: DC

## 2020-09-21 NOTE — Telephone Encounter (Signed)
I have routed this request to Dr Dohmeier for review. The pt is due for the medication and  registry was verified.  

## 2020-10-27 ENCOUNTER — Telehealth: Payer: Self-pay | Admitting: Family Medicine

## 2020-10-27 ENCOUNTER — Other Ambulatory Visit: Payer: Self-pay | Admitting: Neurology

## 2020-10-27 ENCOUNTER — Telehealth: Payer: Self-pay | Admitting: Neurology

## 2020-10-27 NOTE — Telephone Encounter (Signed)
I have routed this request to Dr Dohmeier for review. The pt is due for the medication and [place] registry was verified.  

## 2020-10-27 NOTE — Telephone Encounter (Signed)
Call pt and please remind her to go for labs that were ordered in December  Thank you

## 2020-10-27 NOTE — Telephone Encounter (Signed)
Pt request refill amphetamine-dextroamphetamine (ADDERALL XR) 30 MG 24 hr capsule [id number]

## 2020-10-28 MED ORDER — AMPHETAMINE-DEXTROAMPHET ER 30 MG PO CP24: 30.0000 mg | ORAL_CAPSULE | Freq: Every day | ORAL | 0 refills | Status: DC

## 2020-10-30 ENCOUNTER — Encounter: Payer: Self-pay | Admitting: *Deleted

## 2020-10-30 NOTE — Telephone Encounter (Signed)
Mychart message sent.

## 2020-11-27 ENCOUNTER — Telehealth: Payer: Self-pay | Admitting: Neurology

## 2020-11-27 NOTE — Telephone Encounter (Signed)
Pt has called for a refill on her amphetamine-dextroamphetamine (ADDERALL XR) 30 MG 24 hr capsule [id number]  

## 2020-11-30 ENCOUNTER — Other Ambulatory Visit: Payer: Self-pay | Admitting: Family Medicine

## 2020-11-30 ENCOUNTER — Other Ambulatory Visit: Payer: Self-pay | Admitting: Neurology

## 2020-11-30 DIAGNOSIS — I10 Essential (primary) hypertension: Secondary | ICD-10-CM

## 2020-11-30 MED ORDER — AMPHETAMINE-DEXTROAMPHET ER 30 MG PO CP24: 30.0000 mg | ORAL_CAPSULE | Freq: Every day | ORAL | 0 refills | Status: DC

## 2020-11-30 NOTE — Telephone Encounter (Signed)
I have routed this request to Dr Dohmeier for review. The pt is due for the medication and [place] registry was verified.  

## 2020-12-17 ENCOUNTER — Ambulatory Visit: Payer: 59 | Admitting: Family Medicine

## 2020-12-31 ENCOUNTER — Telehealth: Payer: Self-pay | Admitting: Neurology

## 2020-12-31 ENCOUNTER — Other Ambulatory Visit: Payer: Self-pay | Admitting: Neurology

## 2020-12-31 MED ORDER — AMPHETAMINE-DEXTROAMPHET ER 30 MG PO CP24: 30.0000 mg | ORAL_CAPSULE | Freq: Every day | ORAL | 0 refills | Status: DC

## 2020-12-31 NOTE — Telephone Encounter (Signed)
I have routed this request to Dr Jaynee Eagles for review. The pt is due for the medication and [place] registry was verified.

## 2020-12-31 NOTE — Telephone Encounter (Signed)
Pt request refill amphetamine-dextroamphetamine (ADDERALL XR) 30 MG 24 hr capsule  [id number]

## 2021-01-20 ENCOUNTER — Other Ambulatory Visit: Payer: Self-pay

## 2021-01-20 ENCOUNTER — Ambulatory Visit (INDEPENDENT_AMBULATORY_CARE_PROVIDER_SITE_OTHER): Payer: 59 | Admitting: Family Medicine

## 2021-01-20 ENCOUNTER — Encounter: Payer: Self-pay | Admitting: Family Medicine

## 2021-01-20 VITALS — BP 131/64 | HR 73 | Ht 64.0 in | Wt 226.0 lb

## 2021-01-20 DIAGNOSIS — G35 Multiple sclerosis: Secondary | ICD-10-CM | POA: Diagnosis not present

## 2021-01-20 DIAGNOSIS — I10 Essential (primary) hypertension: Secondary | ICD-10-CM

## 2021-01-20 DIAGNOSIS — R7301 Impaired fasting glucose: Secondary | ICD-10-CM | POA: Diagnosis not present

## 2021-01-20 DIAGNOSIS — F418 Other specified anxiety disorders: Secondary | ICD-10-CM

## 2021-01-20 DIAGNOSIS — Z6838 Body mass index (BMI) 38.0-38.9, adult: Secondary | ICD-10-CM

## 2021-01-20 LAB — POCT GLYCOSYLATED HEMOGLOBIN (HGB A1C): Hemoglobin A1C: 5.2 % (ref 4.0–5.6)

## 2021-01-20 MED ORDER — SERTRALINE HCL 100 MG PO TABS: 150.0000 mg | ORAL_TABLET | Freq: Every day | ORAL | 1 refills | Status: DC

## 2021-01-20 NOTE — Patient Instructions (Signed)
You can check with your insurance to see if they will cover  Saxenda or Wegovy.

## 2021-01-20 NOTE — Assessment & Plan Note (Signed)
Well controlled. Continue current regimen. Follow up in  6 mo. Due for labs.  

## 2021-01-20 NOTE — Progress Notes (Signed)
Established Patient Office Visit  Subjective:  Patient ID: Alyssa Bass, female    DOB: [birth year]  Age: 55 y.o. MRN: [id number]  CC:  Chief Complaint  Patient presents with   Hypertension    HPI Lawana Hartzell presents for   Hypertension- Pt denies chest pain, SOB, dizziness, or heart palpitations.  Taking meds as directed w/o problems.  Denies medication side effects.    Impaired fasting glucose-no increased thirst or urination. No symptoms consistent with hypoglycemia.  F/U Mood - she is currently on 150mg  of sertraline.    Happy with her current regimen and doing well.  She wants to discuss weight loss options as well.  She is interested in [facility].    Past Medical History:  Diagnosis Date   Colloid cyst of brain ([place])    3rd ventricle   Depression    Hypertension    MS (multiple sclerosis) ([place])    Tachycardia     Past Surgical History:  Procedure Laterality Date   ACHILLES TENDON SURGERY     Hagland deformity   COLONOSCOPY W/ POLYPECTOMY     COLONOSCOPY WITH PROPOFOL N/A 05/01/2015   Procedure: COLONOSCOPY WITH PROPOFOL;  Surgeon: Laurence Spates, MD;  Location: [facility];  Service: Endoscopy;  Laterality: N/A;   Left ankle surgery  2004    Family History  Problem Relation Age of Onset   Hypertension Mother    Sleep apnea Mother    Cancer Father        pre cancerous colon   Cancer Other        colon   Heart attack Other        grandmother   Hypertension Brother    Sleep apnea Brother    Sleep apnea Sister     Social History   Socioeconomic History   Marital status: Single    Spouse name: Elta Guadeloupe   Number of children: 2   Years of education: 14   Highest education level: Not on file  Occupational History    Employer: [employer]    Comment: Statistician  Tobacco Use   Smoking status: Former    Packs/day: 1.00    Pack years: 0.00    Types: Cigarettes    Quit date: 08/11/2015    Years since quitting: 5.4   Smokeless tobacco:  Never  Substance and Sexual Activity   Alcohol use: Yes    Alcohol/week: 0.0 standard drinks    Comment: occas.   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   Patient has two children.   Patient is working full-time.   Patient has a college degree.   Patient is right-handed.   Patient drinks 2 cups of coffee daily.      Social Determinants of Health   Financial Resource Strain: Not on file  Food Insecurity: Not on file  Transportation Needs: Not on file  Physical Activity: Not on file  Stress: Not on file  Social Connections: Not on file  Intimate Partner Violence: Not on file    Outpatient Medications Prior to Visit  Medication Sig Dispense Refill   amphetamine-dextroamphetamine (ADDERALL XR) 30 MG 24 hr capsule Take 1 capsule (30 mg total) by mouth daily. 30 capsule 0   Dimethyl Fumarate 240 MG CPDR Take 1 capsule (240 mg total) by mouth 2 (  two) times daily. 830 capsule 3   folic acid (FOLVITE) 1 MG tablet Take 1 mg by mouth daily.  4   lisinopril-hydrochlorothiazide (ZESTORETIC) 20-12.5 MG tablet TAKE 1 TABLET DAILY 90 tablet 3   Propylene Glycol (SYSTANE COMPLETE OP) Apply to eye.     tretinoin (RETIN-A) 0.025 % cream Apply topically.     sertraline (ZOLOFT) 100 MG tablet Take 150 mg by mouth daily. Take 1 1/2 tablets (150 mg total) by mouth daily. Starting 01/20/2021     sertraline (ZOLOFT) 100 MG tablet Take 2 tablets (200 mg total) by mouth daily. 180 tablet 1   No facility-administered medications prior to visit.    No Known Allergies  ROS Review of Systems    Objective:    Physical Exam Constitutional:      Appearance: Normal appearance. She is well-developed.  HENT:     Head: Normocephalic and atraumatic.  Cardiovascular:     Rate and Rhythm: Normal rate and regular rhythm.     Heart sounds: Normal heart sounds.   Pulmonary:     Effort: Pulmonary effort is normal.     Breath sounds: Normal breath sounds.  Skin:    General: Skin is warm and dry.  Neurological:     Mental Status: She is alert and oriented to person, place, and time.  Psychiatric:        Behavior: Behavior normal.    BP 131/64   Pulse 73   Ht 5\' 4"  (1.626 m)   Wt 226 lb (102.5 kg)   SpO2 97%   BMI 38.79 kg/m  Wt Readings from Last 3 Encounters:  01/20/21 226 lb (102.5 kg)  06/19/20 231 lb (104.8 kg)  05/04/20 228 lb (103.4 kg)     Health Maintenance Due  Topic Date Due   PAP SMEAR-Modifier  06/14/2020   COVID-19 Vaccine (2 - Pfizer series) 09/25/2020    There are no preventive care reminders to display for this patient.  Lab Results  Component Value Date   TSH 1.502 06/17/2013   Lab Results  Component Value Date   WBC 4.6 07/31/2018   HGB 13.9 07/31/2018   HCT 40.8 07/31/2018   MCV 95 07/31/2018   PLT 308 07/31/2018   Lab Results  Component Value Date   NA 139 07/31/2018   K 4.8 07/31/2018   CO2 23 07/31/2018   GLUCOSE 93 07/31/2018   BUN 13 07/31/2018   CREATININE 0.74 07/31/2018   BILITOT 0.2 07/31/2018   ALKPHOS 54 07/31/2018   AST 13 07/31/2018   ALT 14 07/31/2018   PROT 7.3 07/31/2018   ALBUMIN 4.5 07/31/2018   CALCIUM 10.0 07/31/2018   Lab Results  Component Value Date   CHOL 187 08/22/2017   Lab Results  Component Value Date   HDL 70 08/22/2017   Lab Results  Component Value Date   LDLCALC 100 (H) 08/22/2017   Lab Results  Component Value Date   TRIG 83 08/22/2017   Lab Results  Component Value Date   CHOLHDL 2.7 08/22/2017   Lab Results  Component Value Date   HGBA1C 5.2 01/20/2021      Assessment & Plan:   Problem List Items Addressed This Visit       Cardiovascular and Mediastinum   HYPERTENSION, BENIGN - Primary    Well controlled. Continue current regimen. Follow up in  6 mo. Due for labs        Relevant Orders   COMPLETE METABOLIC PANEL WITH GFR  Lipid Panel w/reflex Direct LDL   CBC w/Diff/Platelet   TSH     Endocrine   IFG (impaired fasting glucose)   Relevant Orders   POCT glycosylated hemoglobin (Hb A1C) (Completed)   COMPLETE METABOLIC PANEL WITH GFR   Lipid Panel w/reflex Direct LDL   CBC w/Diff/Platelet   TSH     Nervous and Auditory   MULTIPLE SCLEROSIS    She also needs to get some updated blood work before her follow-up appointment for her MS.       Relevant Orders   COMPLETE METABOLIC PANEL WITH GFR   Lipid Panel w/reflex Direct LDL   CBC w/Diff/Platelet   TSH     Other   Anxiety with depression    Stable on current regimen we will make sure that she has refills.  [place] Office Visit from 06/19/2020 in [place]  PHQ-9 Total Score 6             Relevant Medications   sertraline (ZOLOFT) 100 MG tablet   Other Visit Diagnoses     BMI 38.0-38.9,adult          Reminded her to schedule her mammogram.  BMI 38 -we did discuss options.  I think she would be a good candidate for something like Saxenda or even Wegovy.  Wrote down the names of his medications so she could check with insurance explained how they work and explained potential side effects.  If they are covered we could certainly consider it.  We could also consider the quantity and explained how that works in the stomach.  She says she just really struggles with feeling hungry constantly and not feeling full.  Meds ordered this encounter  Medications   sertraline (ZOLOFT) 100 MG tablet    Sig: Take 1.5 tablets (150 mg total) by mouth daily.    Dispense:  135 tablet    Refill:  1    Follow-up: Return in about 6 months (around 07/23/2021) for Hypertension.    Beatrice Lecher, MD

## 2021-01-20 NOTE — Assessment & Plan Note (Signed)
Stable on current regimen we will make sure that she has refills.  [place] Office Visit from 06/19/2020 in [place]  PHQ-9 Total Score 6

## 2021-01-20 NOTE — Assessment & Plan Note (Signed)
She also needs to get some updated blood work before her follow-up appointment for her MS.

## 2021-01-21 LAB — CBC WITH DIFFERENTIAL/PLATELET
Absolute Monocytes: 428 cells/uL (ref 200–950)
Basophils Absolute: 28 cells/uL (ref 0–200)
Basophils Relative: 0.6 %
Eosinophils Absolute: 138 cells/uL (ref 15–500)
Eosinophils Relative: 3 %
HCT: 40.5 % (ref 35.0–45.0)
Hemoglobin: 13.7 g/dL (ref 11.7–15.5)
Lymphs Abs: 1067 cells/uL (ref 850–3900)
MCH: 31.4 pg (ref 27.0–33.0)
MCHC: 33.8 g/dL (ref 32.0–36.0)
MCV: 92.9 fL (ref 80.0–100.0)
MPV: 10.3 fL (ref 7.5–12.5)
Monocytes Relative: 9.3 %
Neutro Abs: 2939 cells/uL (ref 1500–7800)
Neutrophils Relative %: 63.9 %
Platelets: 279 10*3/uL (ref 140–400)
RBC: 4.36 10*6/uL (ref 3.80–5.10)
RDW: 12.1 % (ref 11.0–15.0)
Total Lymphocyte: 23.2 %
WBC: 4.6 10*3/uL (ref 3.8–10.8)

## 2021-01-21 LAB — COMPLETE METABOLIC PANEL WITH GFR
AG Ratio: 1.6 (calc) (ref 1.0–2.5)
ALT: 13 U/L (ref 6–29)
AST: 13 U/L (ref 10–35)
Albumin: 4.5 g/dL (ref 3.6–5.1)
Alkaline phosphatase (APISO): 64 U/L (ref 37–153)
BUN: 15 mg/dL (ref 7–25)
CO2: 26 mmol/L (ref 20–32)
Calcium: 9.5 mg/dL (ref 8.6–10.4)
Chloride: 103 mmol/L (ref 98–110)
Creat: 0.71 mg/dL (ref 0.50–1.03)
Globulin: 2.8 g/dL (calc) (ref 1.9–3.7)
Glucose, Bld: 107 mg/dL — ABNORMAL HIGH (ref 65–99)
Potassium: 4.1 mmol/L (ref 3.5–5.3)
Sodium: 139 mmol/L (ref 135–146)
Total Bilirubin: 0.6 mg/dL (ref 0.2–1.2)
Total Protein: 7.3 g/dL (ref 6.1–8.1)
eGFR: 102 mL/min/{1.73_m2} (ref >=60)

## 2021-01-21 LAB — TSH: TSH: 1.43 mIU/L

## 2021-01-21 LAB — LIPID PANEL W/REFLEX DIRECT LDL
Cholesterol: 228 mg/dL — ABNORMAL HIGH (ref <200)
HDL: 72 mg/dL (ref >=50)
LDL Cholesterol (Calc): 136 mg/dL (calc) — ABNORMAL HIGH
Non-HDL Cholesterol (Calc): 156 mg/dL (calc) — ABNORMAL HIGH (ref <130)
Total CHOL/HDL Ratio: 3.2 (calc) (ref <5.0)
Triglycerides: 96 mg/dL (ref <150)

## 2021-01-29 ENCOUNTER — Other Ambulatory Visit: Payer: Self-pay | Admitting: Family Medicine

## 2021-01-29 DIAGNOSIS — F418 Other specified anxiety disorders: Secondary | ICD-10-CM

## 2021-02-04 ENCOUNTER — Other Ambulatory Visit: Payer: Self-pay | Admitting: Neurology

## 2021-02-04 MED ORDER — AMPHETAMINE-DEXTROAMPHET ER 30 MG PO CP24: 30.0000 mg | ORAL_CAPSULE | Freq: Every day | ORAL | 0 refills | Status: DC

## 2021-02-04 NOTE — Telephone Encounter (Signed)
Pt is requesting a refill for amphetamine-dextroamphetamine (ADDERALL XR) 30 MG 24 hr capsule to  [id number]

## 2021-03-11 ENCOUNTER — Other Ambulatory Visit: Payer: Self-pay | Admitting: Neurology

## 2021-03-11 MED ORDER — AMPHETAMINE-DEXTROAMPHET ER 30 MG PO CP24: 30.0000 mg | ORAL_CAPSULE | Freq: Every day | ORAL | 0 refills | Status: DC

## 2021-03-11 NOTE — Telephone Encounter (Signed)
Pt called needing a refill request for her amphetamine-dextroamphetamine (ADDERALL XR) 30 MG 24 hr capsule [facility] [address].

## 2021-03-11 NOTE — Telephone Encounter (Signed)
Received refill request for Adderall. Last OV was on 05/04/21.  Next OV is scheduled for 05/05/21 .  Last RX was written on 02/09/21 for 30 tabs.   [place] Drug Database has been reviewed.

## 2021-04-05 ENCOUNTER — Other Ambulatory Visit: Payer: Self-pay | Admitting: Neurology

## 2021-04-13 ENCOUNTER — Other Ambulatory Visit: Payer: Self-pay | Admitting: Neurology

## 2021-04-13 NOTE — Telephone Encounter (Signed)
Pt request refill for amphetamine-dextroamphetamine (ADDERALL XR) 30 MG 24 hr capsule [id number] 

## 2021-04-14 MED ORDER — AMPHETAMINE-DEXTROAMPHET ER 30 MG PO CP24: 30.0000 mg | ORAL_CAPSULE | Freq: Every day | ORAL | 0 refills | Status: DC

## 2021-05-05 ENCOUNTER — Ambulatory Visit: Payer: 59 | Admitting: Neurology

## 2021-05-07 ENCOUNTER — Telehealth: Payer: Self-pay | Admitting: Neurology

## 2021-05-07 NOTE — Telephone Encounter (Signed)
[place] Astrid Divine) called, need PA for Dimethyl Fumarate 240 MG CPDR and also required written documentation with supporting documents.  Reference [id number]

## 2021-05-10 NOTE — Telephone Encounter (Signed)
LVM for pt to call office.   Tried initiating PA on CMM w/ insurance we have on file but unable to locate pt. Please ask pt to upload copy of front/back of current insurance to [place] and get verbally from pt while on the phone if she calls back. Thank you

## 2021-05-10 NOTE — Telephone Encounter (Signed)
Holland Falling is who I tried submitting PA on CMM w/. Unable. I called [facility]. Spoke w/ Urban Gibson to get rx benefit info.  ID: [id number] Rxbin: [id number] PCN: none [id number] Other grp# on file: [place] help desk# to [facility]: [phone]  I was able to initiate PA w/ info in bold above on CMM. Key: [id number]. Waiting on ques to be available to complete

## 2021-05-10 NOTE — Telephone Encounter (Signed)
Pt returned phone call.   Insurance information [employer] Member ID: [id number] Group ID: [id number]  Informed Pt as instructed to up load copy back and front of insurance.

## 2021-05-10 NOTE — Telephone Encounter (Signed)
Received PA form for MD to sign. Waiting on signature and then will fax in for review

## 2021-05-11 NOTE — Telephone Encounter (Signed)
PA form faxed to [facility] for review at [phone]. Received fax confirmation. Waiting on determination.

## 2021-05-13 NOTE — Telephone Encounter (Signed)
Alyssa Bass from [phone] option 3) has called to inform the PA must be obtained thru [facility] @ [phone]

## 2021-05-13 NOTE — Telephone Encounter (Signed)
See note below, PA was faxed to [facility], under review currently. Waiting on determination.

## 2021-05-17 NOTE — Telephone Encounter (Signed)
Received a called from [facility] from [place] ([facility]) for the patient to ask more questions in regards to the pt PA.  I provided the answers to the questions and the medication was approved for the pt from 04/17/2021-05/17/2022 Case [id number]

## 2021-05-20 ENCOUNTER — Other Ambulatory Visit: Payer: Self-pay | Admitting: Neurology

## 2021-05-20 MED ORDER — AMPHETAMINE-DEXTROAMPHET ER 30 MG PO CP24: 30.0000 mg | ORAL_CAPSULE | Freq: Every day | ORAL | 0 refills | Status: DC

## 2021-05-20 NOTE — Telephone Encounter (Signed)
Pt has called for a refill on her amphetamine-dextroamphetamine (ADDERALL XR) 30 MG 24 hr capsule [id number]

## 2021-05-20 NOTE — Telephone Encounter (Signed)
Received refill request for Adderall.  Last OV was on 05/04/21.  Next OV is scheduled for 08/02/21 .  Last RX was written on 04/23/21 for 26 tabs.   [place] Drug Database has been reviewed.

## 2021-05-28 ENCOUNTER — Other Ambulatory Visit: Payer: Self-pay | Admitting: *Deleted

## 2021-05-28 DIAGNOSIS — F418 Other specified anxiety disorders: Secondary | ICD-10-CM

## 2021-05-28 MED ORDER — SERTRALINE HCL 100 MG PO TABS: 150.0000 mg | ORAL_TABLET | Freq: Every day | ORAL | 1 refills | Status: DC

## 2021-06-24 ENCOUNTER — Other Ambulatory Visit: Payer: Self-pay | Admitting: Neurology

## 2021-06-24 MED ORDER — AMPHETAMINE-DEXTROAMPHET ER 30 MG PO CP24: 30.0000 mg | ORAL_CAPSULE | Freq: Every day | ORAL | 0 refills | Status: DC

## 2021-06-24 NOTE — Telephone Encounter (Signed)
Pt request refill for amphetamine-dextroamphetamine (ADDERALL XR) 30 MG 24 hr capsule [id number] 

## 2021-07-01 ENCOUNTER — Other Ambulatory Visit: Payer: Self-pay | Admitting: Neurology

## 2021-07-05 ENCOUNTER — Other Ambulatory Visit: Payer: Self-pay | Admitting: Neurology

## 2021-07-26 ENCOUNTER — Encounter: Payer: Self-pay | Admitting: Family Medicine

## 2021-07-26 ENCOUNTER — Other Ambulatory Visit: Payer: Self-pay

## 2021-07-26 ENCOUNTER — Ambulatory Visit (INDEPENDENT_AMBULATORY_CARE_PROVIDER_SITE_OTHER): Payer: 59 | Admitting: Family Medicine

## 2021-07-26 VITALS — BP 134/68 | HR 69 | Temp 98.6°F | Resp 16 | Ht 64.0 in | Wt 226.0 lb

## 2021-07-26 DIAGNOSIS — I10 Essential (primary) hypertension: Secondary | ICD-10-CM | POA: Diagnosis not present

## 2021-07-26 DIAGNOSIS — R7301 Impaired fasting glucose: Secondary | ICD-10-CM | POA: Diagnosis not present

## 2021-07-26 DIAGNOSIS — G35 Multiple sclerosis: Secondary | ICD-10-CM

## 2021-07-26 DIAGNOSIS — G250 Essential tremor: Secondary | ICD-10-CM

## 2021-07-26 DIAGNOSIS — F418 Other specified anxiety disorders: Secondary | ICD-10-CM

## 2021-07-26 DIAGNOSIS — Z23 Encounter for immunization: Secondary | ICD-10-CM

## 2021-07-26 MED ORDER — SERTRALINE HCL 100 MG PO TABS: 150.0000 mg | ORAL_TABLET | Freq: Every day | ORAL | 1 refills | Status: DC

## 2021-07-26 MED ORDER — LISINOPRIL-HYDROCHLOROTHIAZIDE 20-12.5 MG PO TABS: 1.0000 | ORAL_TABLET | Freq: Every day | ORAL | 1 refills | Status: DC

## 2021-07-26 NOTE — Assessment & Plan Note (Signed)
Doing well with sertraline. If having difficulty with splitting then we can change to 2 separate Rx to make a total of 150mg .

## 2021-07-26 NOTE — Progress Notes (Signed)
Established Patient Office Visit  Subjective:  Patient ID: Alyssa Bass, female    DOB: 1966/06/25  Age: 56 y.o. MRN: [id number]  CC:  Chief Complaint  Patient presents with   Hypertension    Follow up. Fasting     HPI Alyssa Bass presents for   Hypertension- Pt denies chest pain, SOB, dizziness, or heart palpitations.  Taking meds as directed w/o problems.  Denies medication side effects.    F/U mood -overall she is doing okay with her sertraline she is having difficulty splitting the pill plans on try to get a pill cutter.  MS-currently follows with Dr. Asencion Partridge Dohmeier.  She was previously followed by Dr. Erling Cruz.  He would also like to go ahead and get labs done today if possible since she does have a follow-up next Monday with Dr. Carey Bullocks.  Recently she has noticed a tremor in her head especially if her neck is in full flexion she has not noticed it with rotation.  She does not have a tremor in her hands.  No family history of tremor. Not sure if related to her MS or not.   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   Depression    Hypertension    MS (multiple sclerosis) ([place])    Tachycardia     Past Surgical History:  Procedure Laterality Date   ACHILLES TENDON SURGERY     Hagland deformity   COLONOSCOPY W/ POLYPECTOMY     COLONOSCOPY WITH PROPOFOL N/A 05/01/2015   Procedure: COLONOSCOPY WITH PROPOFOL;  Surgeon: Laurence Spates, MD;  Location: [facility];  Service: Endoscopy;  Laterality: N/A;   Left ankle surgery  2004    Family History  Problem Relation Age of Onset   Hypertension Mother    Sleep apnea Mother    Cancer Father        pre cancerous colon   Cancer Other        colon   Heart attack Other        grandmother   Hypertension Brother    Sleep apnea Brother    Sleep apnea Sister     Social History   Socioeconomic History   Marital status: Single    Spouse name: Elta Guadeloupe   Number of children: 2   Years  of education: 14   Highest education level: Not on file  Occupational History    Employer: [employer]    Comment: Statistician  Tobacco Use   Smoking status: Former    Packs/day: 1.00    Types: Cigarettes    Quit date: 08/11/2015    Years since quitting: 5.9   Smokeless tobacco: Never  Substance and Sexual Activity   Alcohol use: Yes    Alcohol/week: 0.0 standard drinks    Comment: occas.   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   Patient has two children.   Patient is working full-time.   Patient has a college degree.   Patient is right-handed.   Patient drinks 2 cups of coffee daily.      Social Determinants of Health   Financial Resource Strain: Not on file  Food Insecurity: Not on file  Transportation Needs: Not on file  Physical Activity: Not on file  Stress:  Not on file  Social Connections: Not on file  Intimate Partner Violence: Not on file    Outpatient Medications Prior to Visit  Medication Sig Dispense Refill   amphetamine-dextroamphetamine (ADDERALL XR) 30 MG 24 hr capsule Take 1 capsule (30 mg total) by mouth daily. 30 capsule 0   Dimethyl Fumarate 240 MG CPDR Take 1 capsule (240 mg total) by mouth 2 (two) times daily. 643 capsule 0   folic acid (FOLVITE) 1 MG tablet Take 1 mg by mouth daily.  4   Propylene Glycol (SYSTANE COMPLETE OP) Apply to eye.     tretinoin (RETIN-A) 0.025 % cream Apply topically.     lisinopril-hydrochlorothiazide (ZESTORETIC) 20-12.5 MG tablet TAKE 1 TABLET DAILY 90 tablet 3   sertraline (ZOLOFT) 100 MG tablet Take 1.5 tablets (150 mg total) by mouth daily. 135 tablet 1   No facility-administered medications prior to visit.    No Known Allergies  ROS Review of Systems    Objective:    Physical Exam Constitutional:      Appearance: Normal appearance. She is  well-developed.  HENT:     Head: Normocephalic and atraumatic.  Cardiovascular:     Rate and Rhythm: Normal rate and regular rhythm.     Heart sounds: Normal heart sounds.  Pulmonary:     Effort: Pulmonary effort is normal.     Breath sounds: Normal breath sounds.  Skin:    General: Skin is warm and dry.  Neurological:     Mental Status: She is alert and oriented to person, place, and time.  Psychiatric:        Behavior: Behavior normal.    BP 134/68    Pulse 69    Temp 98.6 F (37 C)    Resp 16    Ht 5' 4"  (1.626 m)    Wt 226 lb (102.5 kg)    LMP 12/16/2013    SpO2 99%    BMI 38.79 kg/m  Wt Readings from Last 3 Encounters:  07/26/21 226 lb (102.5 kg)  01/20/21 226 lb (102.5 kg)  06/19/20 231 lb (104.8 kg)     Health Maintenance Due  Topic Date Due   PAP SMEAR-Modifier  06/14/2020   COVID-19 Vaccine (2 - Pfizer risk series) 09/25/2020    There are no preventive care reminders to display for this patient.  Lab Results  Component Value Date   TSH 1.43 01/20/2021   Lab Results  Component Value Date   WBC 4.6 01/20/2021   HGB 13.7 01/20/2021   HCT 40.5 01/20/2021   MCV 92.9 01/20/2021   PLT 279 01/20/2021   Lab Results  Component Value Date   NA 139 01/20/2021   K 4.1 01/20/2021   CO2 26 01/20/2021   GLUCOSE 107 (H) 01/20/2021   BUN 15 01/20/2021   CREATININE 0.71 01/20/2021   BILITOT 0.6 01/20/2021   ALKPHOS 54 07/31/2018   AST 13 01/20/2021   ALT 13 01/20/2021   PROT 7.3 01/20/2021   ALBUMIN 4.5 07/31/2018   CALCIUM 9.5 01/20/2021   EGFR 102 01/20/2021   Lab Results  Component Value Date   CHOL 228 (H) 01/20/2021   Lab Results  Component Value Date   HDL 72 01/20/2021   Lab Results  Component Value Date   LDLCALC 136 (H) 01/20/2021   Lab Results  Component Value Date   TRIG 96 01/20/2021   Lab Results  Component Value Date   CHOLHDL 3.2 01/20/2021   Lab Results  Component Value Date  HGBA1C 5.2 01/20/2021      Assessment & Plan:    Problem List Items Addressed This Visit       Cardiovascular and Mediastinum   HYPERTENSION, BENIGN    Well controlled. Continue current regimen. Follow up in  6 mo       Relevant Medications   lisinopril-hydrochlorothiazide (ZESTORETIC) 20-12.5 MG tablet   Other Relevant Orders   COMPLETE METABOLIC PANEL WITH GFR   CBC with Differential/Platelet   TSH   Hemoglobin A1c   Direct LDL     Endocrine   IFG (impaired fasting glucose)    Due for A1C.  Usually well controlled.        Relevant Orders   Hemoglobin A1c   Direct LDL     Nervous and Auditory   MULTIPLE SCLEROSIS    Will get additional labs today. Chronic numbness in both hands in fingertips.        Relevant Orders   COMPLETE METABOLIC PANEL WITH GFR   CBC with Differential/Platelet   TSH   Hemoglobin A1c   Direct LDL     Other   Anxiety with depression    Doing well with sertraline. If having difficulty with splitting then we can change to 2 separate Rx to make a total of 151m.        Relevant Medications   sertraline (ZOLOFT) 100 MG tablet   Other Visit Diagnoses     Need for pneumococcal vaccination    -  Primary   Relevant Orders   Pneumococcal conjugate vaccine 20-valent (Prevnar 20) (Completed)   Benign head tremor       Need for immunization against influenza       Relevant Orders   Flu Vaccine QUAD 629moM (Fluarix, Fluzone & Alfiuria Quad PF) (Completed)      I did note a benign head tremor on exam she said she had noticed it more with head and full flexion like when she is praying in church.  But she had it when her head was upright today.  Discussed that it could be an early sign of an essential tremor am not sure if it is related to her MS but did encourage her to speak with her neurologist about it.  Meds ordered this encounter  Medications   lisinopril-hydrochlorothiazide (ZESTORETIC) 20-12.5 MG tablet    Sig: Take 1 tablet by mouth daily.    Dispense:  90 tablet    Refill:  1    sertraline (ZOLOFT) 100 MG tablet    Sig: Take 1.5 tablets (150 mg total) by mouth daily.    Dispense:  135 tablet    Refill:  1    Follow-up: Return in about 6 months (around 01/23/2022) for Hypertension.    CaBeatrice LecherMD

## 2021-07-26 NOTE — Assessment & Plan Note (Addendum)
Will get additional labs today. Chronic numbness in both hands in fingertips.

## 2021-07-26 NOTE — Assessment & Plan Note (Signed)
Well controlled. Continue current regimen. Follow up in  6 mo  

## 2021-07-26 NOTE — Assessment & Plan Note (Signed)
Due for A1C.  Usually well controlled.

## 2021-07-26 NOTE — Progress Notes (Signed)
Per patient last Pap Smear with Dr. Garwin Brothers 2022. Record release form faxed.  Patient has had 3 Covid vaccines.

## 2021-07-26 NOTE — Patient Instructions (Signed)
Recommend schedule an appoint with Dr. Arlice Colt for your MS.

## 2021-07-27 LAB — COMPLETE METABOLIC PANEL WITH GFR
AG Ratio: 1.7 (calc) (ref 1.0–2.5)
ALT: 13 U/L (ref 6–29)
AST: 22 U/L (ref 10–35)
Albumin: 4.8 g/dL (ref 3.6–5.1)
Alkaline phosphatase (APISO): 56 U/L (ref 37–153)
BUN: 12 mg/dL (ref 7–25)
CO2: 31 mmol/L (ref 20–32)
Calcium: 9.8 mg/dL (ref 8.6–10.4)
Chloride: 100 mmol/L (ref 98–110)
Creat: 0.61 mg/dL (ref 0.50–1.03)
Globulin: 2.9 g/dL (calc) (ref 1.9–3.7)
Glucose, Bld: 102 mg/dL — ABNORMAL HIGH (ref 65–99)
Potassium: 5 mmol/L (ref 3.5–5.3)
Sodium: 137 mmol/L (ref 135–146)
Total Bilirubin: 0.4 mg/dL (ref 0.2–1.2)
Total Protein: 7.7 g/dL (ref 6.1–8.1)
eGFR: 107 mL/min/{1.73_m2} (ref >=60)

## 2021-07-27 LAB — CBC WITH DIFFERENTIAL/PLATELET
Absolute Monocytes: 353 cells/uL (ref 200–950)
Basophils Absolute: 49 cells/uL (ref 0–200)
Basophils Relative: 1.2 %
Eosinophils Absolute: 111 cells/uL (ref 15–500)
Eosinophils Relative: 2.7 %
HCT: 39.8 % (ref 35.0–45.0)
Hemoglobin: 13.6 g/dL (ref 11.7–15.5)
Lymphs Abs: 1111 cells/uL (ref 850–3900)
MCH: 32.2 pg (ref 27.0–33.0)
MCHC: 34.2 g/dL (ref 32.0–36.0)
MCV: 94.1 fL (ref 80.0–100.0)
MPV: 11.1 fL (ref 7.5–12.5)
Monocytes Relative: 8.6 %
Neutro Abs: 2476 cells/uL (ref 1500–7800)
Neutrophils Relative %: 60.4 %
Platelets: 291 10*3/uL (ref 140–400)
RBC: 4.23 10*6/uL (ref 3.80–5.10)
RDW: 11.9 % (ref 11.0–15.0)
Total Lymphocyte: 27.1 %
WBC: 4.1 10*3/uL (ref 3.8–10.8)

## 2021-07-27 LAB — TSH: TSH: 1.04 mIU/L

## 2021-07-27 LAB — HEMOGLOBIN A1C
Hgb A1c MFr Bld: 5.4 % of total Hgb (ref <5.7)
Mean Plasma Glucose: 108 mg/dL
eAG (mmol/L): 6 mmol/L

## 2021-07-27 LAB — LDL CHOLESTEROL, DIRECT: Direct LDL: 112 mg/dL — ABNORMAL HIGH (ref <100)

## 2021-07-28 NOTE — Progress Notes (Signed)
Hi Alyssa Bass,  Your metabolic panel is normal V6F looks great! No diabetes. Your thyroid and blood count are normal.Your LDL looks better this time!! [place] work!!

## 2021-07-29 ENCOUNTER — Other Ambulatory Visit: Payer: Self-pay | Admitting: Neurology

## 2021-07-29 ENCOUNTER — Encounter: Payer: Self-pay | Admitting: Neurology

## 2021-07-29 MED ORDER — AMPHETAMINE-DEXTROAMPHET ER 30 MG PO CP24: 30.0000 mg | ORAL_CAPSULE | Freq: Every day | ORAL | 0 refills | Status: DC

## 2021-07-29 NOTE — Telephone Encounter (Signed)
Checked drug registry. Last refilled 06/24/21 #30. Last seen 05/04/20 and next follow up 08/02/21.

## 2021-07-29 NOTE — Telephone Encounter (Signed)
Pt request refill for amphetamine-dextroamphetamine (ADDERALL XR) 30 MG 24 hr capsule [id number] 

## 2021-08-02 ENCOUNTER — Telehealth: Payer: Self-pay | Admitting: Neurology

## 2021-08-02 ENCOUNTER — Ambulatory Visit (INDEPENDENT_AMBULATORY_CARE_PROVIDER_SITE_OTHER): Payer: 59 | Admitting: Neurology

## 2021-08-02 ENCOUNTER — Encounter: Payer: Self-pay | Admitting: Neurology

## 2021-08-02 VITALS — BP 142/79 | HR 70 | Ht 63.0 in | Wt 230.5 lb

## 2021-08-02 DIAGNOSIS — G35 Multiple sclerosis: Secondary | ICD-10-CM | POA: Diagnosis not present

## 2021-08-02 DIAGNOSIS — Z5181 Encounter for therapeutic drug level monitoring: Secondary | ICD-10-CM | POA: Diagnosis not present

## 2021-08-02 DIAGNOSIS — Z9989 Dependence on other enabling machines and devices: Secondary | ICD-10-CM

## 2021-08-02 DIAGNOSIS — R4789 Other speech disturbances: Secondary | ICD-10-CM

## 2021-08-02 DIAGNOSIS — G4733 Obstructive sleep apnea (adult) (pediatric): Secondary | ICD-10-CM | POA: Diagnosis not present

## 2021-08-02 MED ORDER — AMPHETAMINE-DEXTROAMPHET ER 30 MG PO CP24: 30.0000 mg | ORAL_CAPSULE | Freq: Every day | ORAL | 0 refills | Status: DC

## 2021-08-02 NOTE — Patient Instructions (Signed)
Multiple Sclerosis Multiple sclerosis (MS) is a disease of the brain, spinal cord, and optic nerves (central nervous system). It causes the body's disease-fighting (immune) system to destroy the protective covering (myelin sheath) around nerves in the brain. When this happens, signals (nerve impulses) going to and from the brain and spinal cord do not get sent properly or may not get sent at all. There are several types of MS: Relapsing-remitting MS. This is the most common type. This causes sudden attacks of symptoms. After an attack, you may recover completely until the next attack, or some symptoms may remain permanently. Secondary progressive MS. This usually develops after the onset of relapsing-remitting MS. Similar to relapsing-remitting MS, this type also causes sudden attacks of symptoms. Attacks may be less frequent, but symptoms slowly get worse (progress) over time. Primary progressive MS. This causes symptoms that steadily progress over time. This type of MS does not cause sudden attacks of symptoms. The age of onset of MS varies, but it often develops between 55-76 years of age. MS is a lifelong (chronic) condition. There is no cure, but treatment can help slow down the progression of the disease. What are the causes? The cause of this condition is not known. What increases the risk? You are more likely to develop this condition if: You are a woman. You have a relative with MS. However, the condition is not passed from parent to child (inherited). You have a lack (deficiency) of vitamin D. You smoke. MS is more common in the [place] than in the [place]. What are the signs or symptoms? Relapsing-remitting and secondary progressive MS cause symptoms to occur in episodes or attacks that may last weeks to months. There may be long periods between attacks in which there are almost no symptoms. Primary progressive MS causes symptoms to steadily progress after  they develop. Symptoms of MS vary because of the many different ways it affects the central nervous system. The main symptoms include: Vision problems and eye pain. Numbness and weakness. Inability to move your arms, hands, feet, or legs (paralysis). Balance problems. Shaking that you cannot control (tremors). Muscle spasms. Problems with thinking (cognitive changes). MS can also cause symptoms that are associated with the disease, but are not always the direct result of an MS attack. They may include: Inability to control urination or bowel movements (incontinence). Headaches. Fatigue. Inability to tolerate heat. Emotional changes. Depression. Pain. How is this diagnosed? This condition is diagnosed based on: Your symptoms. A neurological exam. This involves checking central nervous system function, such as nerve function, reflexes, and coordination. MRIs of the brain and spinal cord. Lab tests, including a lumbar puncture that tests the fluid that surrounds the brain and spinal cord (cerebrospinal fluid). Tests to measure the electrical activity of the brain in response to stimulation (evoked potentials). How is this treated? There is no cure for MS, but medicines can help decrease the number and frequency of attacks and help relieve nuisance symptoms. Treatment options may include: Medicines that reduce the frequency of attacks. These medicines may be given by injection, by mouth (orally), or through an IV. Medicines that reduce inflammation (steroids). These may provide short-term relief of symptoms. Medicines to help control pain, depression, fatigue, or incontinence. Nutritional counseling. Vitamin D supplements, if you have a deficiency. Using devices to help you move around (assistive devices), such as braces, a cane, or a walker. Physical therapy to strengthen and stretch your muscles. Occupational therapy to help you with everyday  tasks. Alternative or complementary  treatments such as exercise, massage, or acupuncture. Follow these instructions at home: Take over-the-counter and prescription medicines only as told by your health care provider. Do not drive or use heavy machinery while taking prescription pain medicine. Use assistive devices as recommended by your physical therapist or your health care provider. Exercise as directed by your health care provider. Eating healthy can help manage MS symptoms. Return to your normal activities as told by your health care provider. Ask your health care provider what activities are safe for you. Reach out for support. Share your feelings with friends, family, or a support group. Keep all follow-up visits as told by your health care provider and therapists. This is important. Where to find more information National Multiple Sclerosis Society: https://www.nationalmssociety.[place] of Neurological Disorders and Stroke: http://hendricks-barton.net/ [employer]: GourmetRating.dk Contact a health care provider if: You feel depressed. You develop new pain or numbness. You have tremors. You have problems with sexual function. Get help right away if: You develop paralysis. You develop numbness. You have problems with your bladder or bowel function. You develop double vision. You lose vision in one or both eyes. You develop suicidal thoughts. You develop severe confusion. If you ever feel like you may hurt yourself or others, or have thoughts about taking your own life, get help right away. You can go to your nearest emergency department or call: Your local emergency services (911 in the [place] A suicide crisis helpline, such as the [facility] at 276-642-6462. This is open 24 hours a day. Summary Multiple sclerosis (MS) is a disease of the central nervous system that causes the body's immune system to destroy the  protective covering (myelin sheath) around nerves in the brain. There are 3 types of MS: relapsing-remitting, secondary progressive, and primary progressive. Relapsing-remitting and secondary progressive MS cause symptoms to occur in episodes or attacks that may last weeks to months. Primary progressive MS causes symptoms to steadily progress after they develop. There is no cure for MS, but medicines can help decrease the number and frequency of attacks and help relieve nuisance symptoms. Treatment may also include physical or occupational therapy. If you develop numbness, paralysis, vision problems, or other neurological symptoms, get help right away. This information is not intended to replace advice given to you by your health care provider. Make sure you discuss any questions you have with your health care provider. Document Revised: 04/07/2020 Document Reviewed: 04/07/2020 Elsevier Patient Education  2022 [employer].

## 2021-08-02 NOTE — Progress Notes (Signed)
[facility]   Provider:  Larey Seat, MD  Referring Provider:  Primary Care Physician:  Alyssa Marry, MD  Chief Complaint  Patient presents with   Follow-up   Obstructive Sleep Apnea    Rm 10, alone. Here for yearly f/u. Pt reports doing well on CPAP. Pt forgot to bring her SD card to her appt. MS is stable. Has noticed a head tremor last few months. Memory is about the same since last OV. MOCA:29    HPI:   RV  Alyssa Bass is a 56 y.o. female , a patient with MS, seen here as a revisit from Dr. Madilyn Bass for Memory and OSA follow up, she reports sleepiness , helped by adderall.  08-02-2021:  Patient is reporting good compliance to CPAP, feeling well. She is here to check her yearly [place].  MOCA was 29-30 points.  She reports a head nodding tremor, and no hand tremor.    10-25-2021_Tecfidera refills through [place]- delayed.  She needs medication- ran out 2 days ago. Alyssa Bass is not seen here today for excessive daytime sleepiness and her Epworth sleepiness score was endorsed at only 6 points fatigue severity score was not endorsed, we are not meeting to follow-up on CPAP today just she needs her Tecfidera have refilled. She is on adderall for daytime alertness. Uses CPAP. Has sometimes vertigo but not recently, reports all fingertips are numbish.    I had the pleasure of seeing Alyssa Bass today on 30 January 2019.  The patient has been followed in our sleep clinic for excessive daytime sleepiness but her current Epworth sleepiness score was only endorsed at 3 points, her fatigue severity at 27 out of 63 possible points. She was given a new CPAP machine 11-06-2018 which had difficulties to pick up the data and it seems that [facility] ( [facility])  tried to guide her by telephone through the process but I have only available the data from 5/24 through 12/31/2018 and of those only the last 10 days are actually recording any  data.   Therefor this is a short compliance visit.  The patient assured me that she is using her machine.  She also underwent a MOCA test, scoring 29/30 point.         Patient uses a CPAP machine that is about 54 years old, cannot be interrogated by Wi-Fi. Do not have compliance data for her CPAP today but she states that her CPAP therapy is going well, but she has been using the same settings and supplies.  There has been no need to change interfaces, humidity settings or otherwise.  If she needs a new machine she would definitely be allowed to get one. Patient was presenting  in 1997 with symptoms of optic neuritis which by the year 2000 for joint by symptoms of sensory abnormalities , and she was finally diagnosed with multiple sclerosis.  Spinal test revealed in 2000 that oligoclonal bands were present and establish the diagnosis. She was followed for a while by Dr. Morene Antu, and  by [facility] transiently seen Dr. 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 She has been followed in our office again since 2015. In our last visit we discussed repeating an MRI but her co-pay was very high and she decided to wait.  She would like of course to know if the colloid cyst has grown and also if there are new lesions.  She had clinically no evidence of an MS relapse on Tecfidera. She was last seen in 2015- she has been using adderall for daytime sleepiness.  She has reported delayed memory- names , words, but she is not getting lost.  She performed today excellently on a Montreal cognitive assessment test 30 out of 30 points    HISTORY OF PRESENT ILLNESS:   (CD): after delivering a healthy baby daughter. A spinal tap had revealed only one band, but she developed optic neuritis in 2000, when she developed sensory symptoms.  A repeat CSF test revealed than in 2000 the oligoclonal bands and established the diagnosis.   Alyssa Bass, a former patient of Dr. Morene Antu, has been followed  by him for her multiple sclerosis- but she also carries a diagnosis of an AVM and intra-ventricular cyst.   To screen for progression in both diseases she has undergone regular MRIs. She is also followed at [facility] where she was seen, from where the more recent imaging studies are available .  I have a copy here of her last MRI from 03/2011 showing that the patient has multiple foci of periventricular and deep white matter lesions but predominantly perivascular.  Her white matter disease has gradually progressed since the Baseline image and examination in 2002.   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 There was also no significant change in the left frontal venous angioma.  She has had elevated LFTs in the past, and a positive JC Virus antibody screen.   05/12/14 (LL): Alyssa Bass returns for 6 month revisit, she has been doing well on Tecfidera, no relapses or new symptoms since last visit.  Has flushing as her only side effect which she states is random times. She continues to feel that her memory is not what it once was and has occasional positional dizziness, but does not have it at this time. She continues to have mild numbness in her right fingers, this has been long-standing. She feels much better since switching to Adderall XR taking one a day because she often forget the second dose of the immediate release, when she remembered it was too late in the day to take it.    I reviewed the patient's CPAP compliance data from 02/11/14 to 05/11/14, which is a total of 90 days, during which time the patient used CPAP every day except for 1 day. The average usage for all days was 8 hours and 2 minutes. The percent used days greater than 4 hours was 98%, indicating excellent compliance. The residual AHI was 0.2 per hour, indicating an good treatment pressure of 10 cwp with EPR of 3. Air leak from the mask was  median 0.8. She is using the nasal pillow, but has a concern with the mask strap causing hair loss behind the temples. She endorses a huge improvement in her sleep using he cpap.  01-03-14 CD  I see Alyssa Bass today for a follow up on fatigue and excessive daytime sleepiness.   The patient is on CPAP for OSA and was followed by a [place] sleep clinic, Dr. Mickel Fuchs saw her. The dependence got closed.   She was diagnosed based on a overall AHI of only 7.8 and REM dependent AHi 22.4 with desaturations in oxygen.   CPAP has been used at 10 cm water since, with a nasal mask. She doesn't like the  straps on her head.   She would be delighted to get a new headset - and was happy to hear her download and compliance reviewed as high.   The download encompasses 453 days since the last download !. The patient has a 96% compliance, she was daily by time is 7 hours and 46 minutes and the residual AHI is only 0.2.   This is an almost complete resolution of apnea with one of the highest compliance is. Set pressures 10 cm to 3 cm EPR. She still has a moderate air leak.   I would like for her to try a nasal pillow .    11/08/13 (LL): Alyssa Bass returns for followup, she has been on Tecfidera for about 4 months and loves it.  Interval history from 09/07/2016, I have pleasure of seeing Alyssa Bass after a 2 year hiatus. She has been followed by our nurse practitioners is a compliant CPAP user and here today for a compliance visit as well is discussing her MS status, she continues to be very happy with tecfidera treatments and has only occasionally flushing. The patient's download from her CPAP today reveals a residual apnea index of only 0.697% compliance for days and 90% compliance for over 4 hours of consecutive use, with average user time of 6 hours and 54 minutes. CPAP is set at 10 cm water with 3 cm expiratory pressure relief. Epworth sleepiness score was endorsed at 6 points fatigue severity score at 32  points. The patient reports that her divorce with stressful understandably, that her blood pressure and heart rate went up during times of legal fights, she paused her Adderall medication. She now has found her new baseline, continues to work productively and feels that she needs a refill on Adderall. Interestingly, she reports that Adderall increases her appetite. Her last MRI was  2-3 years ago., she reports memory decline.      Review of Systems: Out of a complete 14 system review, the patient complains of only the following symptoms, and all other reviewed systems are negative.  Memory loss,  Weight gain, HTN, inattentiveness, unable to focus.   Numbness in fingers.   Vertigo  EDS. On CPAP for OSA.   Social History   Socioeconomic History   Marital status: Single    Spouse name: Elta Guadeloupe   Number of children: 2   Years of education: 14   Highest education level: Not on file  Occupational History    Employer: [employer]    Comment: Statistician  Tobacco Use   Smoking status: Former    Packs/day: 1.00    Types: Cigarettes    Quit date: 08/11/2015    Years since quitting: 5.9   Smokeless tobacco: Never  Substance and Sexual Activity   Alcohol use: Yes    Alcohol/week: 0.0 standard drinks    Comment: occas.   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   Patient has two children.   Patient is working full-time.   Patient has a college degree.   Patient is right-handed.   Patient drinks 2 cups of coffee daily.      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 30 capsule 0   Dimethyl Fumarate 240 MG CPDR Take 1 capsule (240 mg total) by mouth 2 (two) times daily. 314 capsule 0   folic acid (FOLVITE) 1 MG tablet Take 1 mg by mouth daily.  4   lisinopril-hydrochlorothiazide (ZESTORETIC) 20-12.5 MG tablet Take 1 tablet by mouth daily. 90 tablet 1   Propylene Glycol (SYSTANE COMPLETE OP) Apply to eye.     sertraline (ZOLOFT) 100 MG tablet Take 1.5 tablets (150 mg total) by mouth daily. 135 tablet 1   tretinoin (RETIN-A) 0.025 % cream Apply topically.     No current facility-administered medications for this visit.    Allergies as of 08/02/2021   (No Known Allergies)    Vitals: BP (!) 142/79    Pulse 70    Ht 5\' 3"  (1.6 m)    Wt 230 lb 8 oz (104.6 kg)    LMP 12/16/2013    BMI 40.83 kg/m  Last Weight:  Wt Readings from Last 1 Encounters:  08/02/21 230 lb 8 oz (104.6 kg)   HFW:YOVZ mass index is 40.83 kg/m.     Last Height:   Ht Readings from Last 1 Encounters:  08/02/21 5\' 3"  (1.6 m)    Physical exam:  General: The  patient is awake, alert and appears not in acute distress. The patient is well groomed. Head: Normocephalic, atraumatic. Neck is supple. Mallampati 3. neck circumference:17' . Nasal airflow patent  . Retrognathia is not seen.  Crowded dental status. invisaline user.   Cardiovascular:  Regular rate and rhythm , without  murmurs or carotid bruit, and without distended neck veins. Respiratory: Lungs are clear to auscultation. Skin:  Without evidence of edema, or rash Trunk: BMI is 37 kg/m2 . The patient's posture is erect   Neurologic exam : The patient is awake and alert, oriented to place and time.   Memory subjective impaired.  MOCA: Montreal Cognitive Assessment  08/02/2021 01/30/2019 07/31/2018 08/22/2017  Visuospatial/ Executive (0/5) 5 5 4 5   Naming (0/3) 3 3 2 3   Attention: Read list of digits (0/2) 2 2 2 2   Attention: Read list of letters (0/1) 1 1 1  1  Attention: Serial 7 subtraction starting at 100 (0/3) 3 3 3 3   Language: Repeat phrase (0/2) 2 2 2 2   Language : Fluency (0/1) 0 0 1 1  Abstraction (0/2) 2 2 2 2   Delayed Recall (0/5) 5 5 2 5   Orientation (0/6) 6 6 6 6   Total 29 29 25 30    MMSE:No flowsheet data found.   Attention span & concentration ability appears normal.  Speech is fluent,  without dysarthria, dysphonia or aphasia.  Mood and affect are appropriate, pleasant, cooperative..  Cranial nerves: Pupils are equal and briskly reactive to light.  Funduscopic exam without evidence of pallor or edema. Extraocular movements  in vertical and horizontal planes intact and without nystagmus.  Facial sensation intact to fine touch. Facial motor strength is symmetric and tongue and uvula move midline. Shoulder shrug was symmetrical.   Motor exam: Normal tone, muscle bulk and symmetric strength in all extremities. Sensory:  Fine touch, pinprick and vibration were tested in all extremities.  There is numbness in the right hand fingers. Proprioception tested in the upper  extremities was normal.Coordination: Rapid alternating movements and  Finger-to-nose maneuver  normal without evidence of ataxia, dysmetria There is  Tremor in the right hand.. Gait and station: Patient walks without assistive device .   Deep tendon reflexes: in the upper and lower extremities are symmetric and intact. Babinski maneuver response is downgoing.  The patient was advised of the nature of the diagnosed sleep disorder OSA ,  the treatment options and risks for general a health and wellness arising from not treating the condition.  She has some early cataracts. She has not had MS related brain MRI in a while-  She remains on Tecfidera but had refill issues. .   I spent more than 15 minutes of face to face time with the patient. Greater than 50% of time was spent in counseling and coordination of care. We have discussed the diagnosis and differential and I answered the patient's questions.       STUDY DATE: 08/11/18 PATIENT NAME: Alyssa Bass DOB: 1965/08/15 MRN: [id number]   ORDERING CLINICIAN: Dennie Bible, NP / Alyssa Seat, MD  CLINICAL HISTORY: 56 year old female with multiple sclerosis.    EXAM: MRI brain (with and without)  TECHNIQUE: MRI of the brain with and without contrast was obtained utilizing 5 mm axial slices with T1, T2, T2 flair, SWI and diffusion weighted views.  T1 sagittal, T2 coronal and postcontrast views in the axial and coronal plane were obtained. CONTRAST: 40ml multihance  COMPARISON:  IMAGING SITE: [facility] [address] (1.5 Tesla MRI)   FINDINGS:    No abnormal lesions are seen on diffusion-weighted views to suggest acute ischemia. The cortical sulci, fissures and cisterns are normal in size and appearance.    Third ventricle / foramen of munro, midline colloid cyst noted.  This measures 11x12x27mm (AP x trans x SI).  No associated hydrocephalus.   Lateral, third and fourth ventricle are otherwise normal in size and  appearance. No extra-axial fluid collections are seen. No evidence of mass effect or midline shift.     Multiple round and ovoid, periventricular, peri-callosal, subcortical, chronic demyelinating plaques.   No abnormal lesions are seen on post contrast views.      Reviewed her labs,    Assessment:  After physical and neurologic examination, review of laboratory studies,  Personal review of imaging studies, reports of other /same  Imaging studies ,  Results of polysomnography/  neurophysiology testing and pre-existing records as far as provided in visit., my assessment is   1) we will continue Tecfidera oral treatment for multiple sclerosis. I will order an MRI of the brain to have a current baseline / to repeat , since there are new symptoms. Brain and cervical spine -labs were done by her PCP.     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   2) My goal is to have a revisit every 12 month for repeat memory testing by [facility] with NP . I will for once for the next 3-4 months schedule an extraordinary RV for follow up on MRI and HST.  3) I will refill Adderall to help with concentration and focus and alertness.  4) obstructive sleep apnea - not addressed today - we never had a sleep study with this patient.    Plan:  Treatment plan and additional workup : Rv with NP in 12 month refill adderall XR at 30 mg, MOCA . Adderall refill, [facility] - does she new machine- this one has a chip card. !  She is not sure where she had last test ( [place]/) and we need a baseline . HST ordered.      Asencion Partridge Danisa Kopec MD  08/02/2021   CC: Alyssa Bass, [address] [place],  [place] [address]

## 2021-08-02 NOTE — Telephone Encounter (Signed)
Pt requesting amphetamine-dextroamphetamine (ADDERALL XR) 30 MG 24 hr capsule sent to another pharmacy due medication is on back order. Please sent to: [facility]  [place], [place] [address] Phone: [phone]

## 2021-08-04 ENCOUNTER — Telehealth: Payer: Self-pay | Admitting: Neurology

## 2021-08-04 NOTE — Telephone Encounter (Signed)
[employer] order sent to GI, they will reach out to the patient to schedule.

## 2021-08-05 ENCOUNTER — Telehealth: Payer: Self-pay

## 2021-08-05 NOTE — Telephone Encounter (Signed)
LVM for pt to call me back to schedule sleep study  

## 2021-08-12 ENCOUNTER — Telehealth: Payer: Self-pay

## 2021-08-12 NOTE — Telephone Encounter (Signed)
LVM for pt to call me back to schedule sleep study  

## 2021-08-19 ENCOUNTER — Ambulatory Visit
Admission: RE | Admit: 2021-08-19 | Discharge: 2021-08-19 | Disposition: A | Discharge status: Home / Self Care | Payer: 59 | Source: Ambulatory Visit | Attending: Neurology | Admitting: Neurology

## 2021-08-19 DIAGNOSIS — R4789 Other speech disturbances: Secondary | ICD-10-CM

## 2021-08-19 DIAGNOSIS — G35 Multiple sclerosis: Secondary | ICD-10-CM | POA: Diagnosis not present

## 2021-08-19 DIAGNOSIS — Z5181 Encounter for therapeutic drug level monitoring: Secondary | ICD-10-CM

## 2021-08-19 DIAGNOSIS — G4733 Obstructive sleep apnea (adult) (pediatric): Secondary | ICD-10-CM

## 2021-08-19 MED ORDER — GADOBENATE DIMEGLUMINE 529 MG/ML IV SOLN
20.0000 mL | Freq: Once | INTRAVENOUS | Status: AC | PRN
  Administered 2021-08-19: 20 mL via INTRAVENOUS

## 2021-08-24 ENCOUNTER — Telehealth: Payer: Self-pay

## 2021-08-24 NOTE — Telephone Encounter (Signed)
We have attempted to call the patient 2 times to schedule sleep study. Patient has been unavailable at the phone numbers we have on file and has not returned our calls. If patient calls back we will schedule them for their sleep study. ° °

## 2021-08-26 ENCOUNTER — Encounter: Payer: Self-pay | Admitting: Neurology

## 2021-08-26 NOTE — Progress Notes (Signed)
MRI cervical spine (with and without) for this MS patient is demonstrating: - Chronic demyelinating plaque at C2-3 in the right posterior region. - No acute plaques.   No changes since last imaging study.   V. PENUMALLI, MD

## 2021-10-04 ENCOUNTER — Other Ambulatory Visit: Payer: Self-pay

## 2021-10-04 MED ORDER — DIMETHYL FUMARATE 240 MG PO CPDR: 1.0000 | DELAYED_RELEASE_CAPSULE | Freq: Two times a day (BID) | ORAL | 3 refills | Status: DC

## 2021-11-05 ENCOUNTER — Encounter: Payer: 59 | Admitting: Sports Medicine

## 2021-11-09 ENCOUNTER — Other Ambulatory Visit: Payer: Self-pay | Admitting: Neurology

## 2021-11-09 MED ORDER — AMPHETAMINE-DEXTROAMPHET ER 30 MG PO CP24
30.0000 mg | ORAL_CAPSULE | Freq: Every day | ORAL | 0 refills | Status: DC
Start: 2021-11-09 — End: 2021-12-13

## 2021-11-09 NOTE — Telephone Encounter (Signed)
Pt requesting refill of amphetamine-dextroamphetamine (ADDERALL XR) 30 MG 24 hr capsule [id number] ?

## 2021-11-09 NOTE — Telephone Encounter (Signed)
I have routed this request to Dr Dohmeier for review. The pt is due for the medication and [place] registry was verified.  

## 2021-11-26 ENCOUNTER — Telehealth: Payer: Self-pay | Admitting: Family Medicine

## 2021-11-26 NOTE — Telephone Encounter (Signed)
Called [facility].  They will fax over pt's pap smear from 2020.  Charyl Bigger, CMA

## 2021-11-26 NOTE — Telephone Encounter (Signed)
Call [facility] for her last Pap smear.  The one in 2018 was abnormal and she was supposed to go back for repeat in 19.

## 2021-12-01 ENCOUNTER — Ambulatory Visit (INDEPENDENT_AMBULATORY_CARE_PROVIDER_SITE_OTHER): Payer: 59 | Admitting: Family Medicine

## 2021-12-01 ENCOUNTER — Encounter: Payer: Self-pay | Admitting: Family Medicine

## 2021-12-01 VITALS — BP 152/81 | HR 72 | Ht 63.0 in | Wt 215.0 lb

## 2021-12-01 DIAGNOSIS — R21 Rash and other nonspecific skin eruption: Secondary | ICD-10-CM

## 2021-12-01 DIAGNOSIS — M2540 Effusion, unspecified joint: Secondary | ICD-10-CM

## 2021-12-01 MED ORDER — MELOXICAM 15 MG PO TABS: 15.0000 mg | ORAL_TABLET | Freq: Every day | ORAL | 1 refills | Status: DC

## 2021-12-01 NOTE — Progress Notes (Signed)
Established Patient Office Visit  Subjective   Patient ID: Alyssa Bass, female    DOB: March 05, 1966  Age: 56 y.o. MRN: [id number]  Chief Complaint  Patient presents with   Discuss Referral     Patient stated she was dx with possible osteoarthritis by [place]. Patient would like to discuss referral to Rheumatology.    Rash    Purple discoloration rash on arms. Patient stated she had tried steroid creams and prednisone with some improvement.    Pap Smear    Done 2022 with Dr. Garwin Brothers release form faxed.     HPI  Patient stated she was dx with possible osteoarthritis by [place]. Patient would like to discuss referral to Rheumatology.  They did do some labs in April including a CBC, sed rate rheumatoid factor CRP and ANA.  So had an x-ray taken of the right hand which showed some mild diffuse degenerative changes most notably in the DIP joint of the right index finger.  She is most concerned because she has had significant swelling in the PIP joint of the middle finger on her right hand for a little greater than a month at this point.  It has not gone down.  No trauma or injury.  No other swollen joints today.  Purple discoloration rash on arms.  She says it started sometime back in February so about 3 months ago.  Initially it was more of a light pink color but now it is more of a purple color.. Patient stated she had tried steroid creams and prednisone with some improvement.  It is still present.  Initially it was a little bit itchy but now it is not really bothersome.  She has areas on her upper arms, side near her waistband, on her low back.    ROS    Objective:     BP (!) 152/81   Pulse 72   Ht '5\' 3"'$  (1.6 m)   Wt 215 lb (97.5 kg)   LMP 12/16/2013   SpO2 99%   BMI 38.09 kg/m    Physical Exam Vitals reviewed.  Constitutional:      Appearance: She is well-developed.  HENT:     Head: Normocephalic and atraumatic.  Eyes:     Conjunctiva/sclera: Conjunctivae  normal.  Cardiovascular:     Rate and Rhythm: Normal rate.  Pulmonary:     Effort: Pulmonary effort is normal.  Musculoskeletal:     Comments: Significant swelling of the PIP joint of the middle finger on the right hand.  Skin:    General: Skin is dry.     Coloration: Skin is not pale.     Comments: She has a very maculopapular rash with fine scale on her right anterior abdomen going towards her hip.  She has some similar almost purple discolored lesions on her low back and upper arms but these are more flat with no significant scale.  Neurological:     Mental Status: She is alert and oriented to person, place, and time.  Psychiatric:        Behavior: Behavior normal.     No results found for any visits on 12/01/21.    The 10-year ASCVD risk score (Arnett DK, et al., 2019) is: 2.7%    Assessment & Plan:   Problem List Items Addressed This Visit   None Visit Diagnoses     Joint swelling    -  Primary   Relevant Orders   C-reactive protein   Uric acid  Cyclic citrul peptide antibody, IgG   TSH   CBC with Differential/Platelet   Rash       Relevant Orders   C-reactive protein   Uric acid   Cyclic citrul peptide antibody, IgG   TSH   CBC with Differential/Platelet   Fungal Stain       Joint swelling on the right hand in the PIP joint on the middle finger-most consistent with synovitis based on exam.  An x-ray did not show any severe arthritis at that joint.  We will do some additional blood work today including an anti-CCP.  We discussed that with rheumatoid it typically affects multiple joints and can oftentimes move around so it is possible this could be the beginning of an early signs of rheumatoid but right now she does not have enough criteria to meet diagnosis.  But we can certainly do a little further work-up.  Rash-skin scraping performed of area on her right lower abdomen really almost has the appearance of a tinea except for the fact that it is has more of a  purple discoloration but some are macular and some are just slightly papular with a very fine scale.  We will call with results.  She has used the clotrimazole and the triamcinolone cream as well as Zyrtec.  No follow-ups on file.    Beatrice Lecher, MD

## 2021-12-02 LAB — CBC WITH DIFFERENTIAL/PLATELET
Absolute Monocytes: 370 cells/uL (ref 200–950)
Basophils Absolute: 40 cells/uL (ref 0–200)
Basophils Relative: 0.9 %
Eosinophils Absolute: 110 cells/uL (ref 15–500)
Eosinophils Relative: 2.5 %
HCT: 42 % (ref 35.0–45.0)
Hemoglobin: 14.1 g/dL (ref 11.7–15.5)
Lymphs Abs: 1276 cells/uL (ref 850–3900)
MCH: 31.8 pg (ref 27.0–33.0)
MCHC: 33.6 g/dL (ref 32.0–36.0)
MCV: 94.6 fL (ref 80.0–100.0)
MPV: 10.8 fL (ref 7.5–12.5)
Monocytes Relative: 8.4 %
Neutro Abs: 2605 cells/uL (ref 1500–7800)
Neutrophils Relative %: 59.2 %
Platelets: 284 10*3/uL (ref 140–400)
RBC: 4.44 10*6/uL (ref 3.80–5.10)
RDW: 12 % (ref 11.0–15.0)
Total Lymphocyte: 29 %
WBC: 4.4 10*3/uL (ref 3.8–10.8)

## 2021-12-02 LAB — TSH: TSH: 1.7 mIU/L

## 2021-12-02 LAB — CYCLIC CITRUL PEPTIDE ANTIBODY, IGG: Cyclic Citrullin Peptide Ab: <16 UNITS

## 2021-12-02 LAB — URIC ACID: Uric Acid, Serum: 5 mg/dL (ref 2.5–7.0)

## 2021-12-02 LAB — C-REACTIVE PROTEIN: CRP: 4.8 mg/L (ref <8.0)

## 2021-12-02 NOTE — Progress Notes (Signed)
Alyssa Bass, additional labs so far look good.  No sign of gout, thyroid disease or anemia.  The more sophisticated rheumatoid test is still pending.  And the skin scraping is still pending.

## 2021-12-03 LAB — FUNGAL STAIN
FUNGAL SMEAR:: NONE SEEN
MICRO NUMBER:: 13444093
SPECIMEN QUALITY:: ADEQUATE

## 2021-12-03 NOTE — Progress Notes (Signed)
The rheumatoid test is negative.  Skin scraping still pending.

## 2021-12-07 NOTE — Progress Notes (Signed)
Hi Alyssa Bass, skin scraping was negative for any type of fungal elements so this rules out all forms of tinea.  So typically we would treat it with a steroid which you have already been doing and it does not seem to be going away.  So the next step would be to either get you in with a different dermatologist for second opinion or do a skin biopsy of one of the lesions and send that off to see what they say.  Let me know if you have a preference.

## 2021-12-10 ENCOUNTER — Other Ambulatory Visit: Payer: Self-pay | Admitting: Neurology

## 2021-12-10 NOTE — Telephone Encounter (Signed)
Pt is requesting a refill for amphetamine-dextroamphetamine (ADDERALL XR) 30 MG 24 hr capsule.  [id number]   

## 2021-12-13 MED ORDER — AMPHETAMINE-DEXTROAMPHET ER 30 MG PO CP24: 30.0000 mg | ORAL_CAPSULE | Freq: Every day | ORAL | 0 refills | Status: DC

## 2021-12-13 NOTE — Telephone Encounter (Signed)
Last OV was on 08/02/21.  Next OV is scheduled for 01/18/22.  Last RX was written on 11/09/21 for 30 tabs.   [place] Drug Database has been reviewed. Please e-scribe as work in [place].

## 2021-12-13 NOTE — Addendum Note (Signed)
Addended by: Georgiann Cocker on: 12/13/2021 07:31 AM   Modules accepted: Orders

## 2022-01-17 ENCOUNTER — Other Ambulatory Visit: Payer: Self-pay | Admitting: Neurology

## 2022-01-17 ENCOUNTER — Other Ambulatory Visit: Payer: Self-pay | Admitting: *Deleted

## 2022-01-17 MED ORDER — AMPHETAMINE-DEXTROAMPHET ER 30 MG PO CP24: 30.0000 mg | ORAL_CAPSULE | Freq: Every day | ORAL | 0 refills | Status: DC

## 2022-01-17 NOTE — Telephone Encounter (Signed)
Reviewed pt chart. Last seen 08/02/21 and next f/u 01/18/22. Checked drug registry. Last refilled 12/13/21 #30. Sent request to MD to e-scribe refill.

## 2022-01-17 NOTE — Telephone Encounter (Signed)
Pt is requesting a refill for amphetamine-dextroamphetamine (ADDERALL XR) 30 MG 24 hr capsule .  [facility]: [id number]

## 2022-01-18 ENCOUNTER — Ambulatory Visit: Payer: 59 | Admitting: Neurology

## 2022-01-25 ENCOUNTER — Ambulatory Visit (INDEPENDENT_AMBULATORY_CARE_PROVIDER_SITE_OTHER): Payer: 59 | Admitting: Family Medicine

## 2022-01-25 ENCOUNTER — Encounter: Payer: Self-pay | Admitting: Family Medicine

## 2022-01-25 VITALS — BP 134/69 | HR 76 | Ht 63.0 in | Wt 224.0 lb

## 2022-01-25 DIAGNOSIS — I10 Essential (primary) hypertension: Secondary | ICD-10-CM | POA: Diagnosis not present

## 2022-01-25 DIAGNOSIS — E669 Obesity, unspecified: Secondary | ICD-10-CM

## 2022-01-25 DIAGNOSIS — F418 Other specified anxiety disorders: Secondary | ICD-10-CM | POA: Diagnosis not present

## 2022-01-25 DIAGNOSIS — R7301 Impaired fasting glucose: Secondary | ICD-10-CM

## 2022-01-25 DIAGNOSIS — G35 Multiple sclerosis: Secondary | ICD-10-CM | POA: Diagnosis not present

## 2022-01-25 LAB — POCT GLYCOSYLATED HEMOGLOBIN (HGB A1C): Hemoglobin A1C: 5.2 % (ref 4.0–5.6)

## 2022-01-25 MED ORDER — AMPHETAMINE-DEXTROAMPHET ER 30 MG PO CP24: 30.0000 mg | ORAL_CAPSULE | Freq: Every day | ORAL | 0 refills | Status: DC

## 2022-01-25 NOTE — Addendum Note (Signed)
Addended by: Larey Seat on: 01/25/2022 05:28 PM   Modules accepted: Orders

## 2022-01-25 NOTE — Assessment & Plan Note (Signed)
Well with sertraline.  Would like to continue with current regimen.  PHQ-9 and GAD-7 look great.

## 2022-01-25 NOTE — Assessment & Plan Note (Signed)
We discussed the possibility of Wegovy or Saxenda.  We will write down the names of his medications and she can check with insurance to see if they are covered.  Contrave may be a secondary option that is not a GLP-1 as well.

## 2022-01-25 NOTE — Assessment & Plan Note (Signed)
Well controlled. Continue current regimen. Follow up in  6 mo  

## 2022-01-25 NOTE — Assessment & Plan Note (Signed)
She would like a referral to see Dr. Rachel Moulds.

## 2022-01-25 NOTE — Assessment & Plan Note (Signed)
1C looks phenomenal today at 5.2.

## 2022-01-25 NOTE — Patient Instructions (Signed)
We discussed the possibility of Wegovy or Saxenda.  We will write down the names of his medications and she can check with insurance to see if they are covered.  Contrave may be a secondary option that is not a GLP-1 as well.

## 2022-01-25 NOTE — Progress Notes (Signed)
   Established Patient Office Visit  Subjective   Patient ID: Alyssa Bass, female    DOB: 11/25/1965  Age: 56 y.o. MRN: [id number]  Chief Complaint  Patient presents with   Hypertension   ifg    HPI Hypertension- Pt denies chest pain, SOB, dizziness, or heart palpitations.  Taking meds as directed w/o problems.  Denies medication side effects.    Impaired fasting glucose-no increased thirst or urination. No symptoms consistent with hypoglycemia.  She is interested in weight loss medication.  Particularly the GLP-1's.    ROS    Objective:     BP 134/69   Pulse 76   Ht '5\' 3"'$  (1.6 m)   Wt 224 lb (101.6 kg)   LMP 12/16/2013   SpO2 95%   BMI 39.68 kg/m    Physical Exam Vitals and nursing note reviewed.  Constitutional:      Appearance: She is well-developed.  HENT:     Head: Normocephalic and atraumatic.  Cardiovascular:     Rate and Rhythm: Normal rate and regular rhythm.     Heart sounds: Normal heart sounds.  Pulmonary:     Effort: Pulmonary effort is normal.     Breath sounds: Normal breath sounds.  Skin:    General: Skin is warm and dry.  Neurological:     Mental Status: She is alert and oriented to person, place, and time.  Psychiatric:        Behavior: Behavior normal.      Results for orders placed or performed in visit on 01/25/22  POCT glycosylated hemoglobin (Hb A1C)  Result Value Ref Range   Hemoglobin A1C 5.2 4.0 - 5.6 %   HbA1c POC (<> result, manual entry)     HbA1c, POC (prediabetic range)     HbA1c, POC (controlled diabetic range)        The 10-year ASCVD risk score (Arnett DK, et al., 2019) is: 2.1%    Assessment & Plan:   Problem List Items Addressed This Visit       Cardiovascular and Mediastinum   HYPERTENSION, BENIGN    Well controlled. Continue current regimen. Follow up in  6 mo         Endocrine   IFG (impaired fasting glucose) - Primary    1C looks phenomenal today at 5.2.      Relevant Orders   POCT  glycosylated hemoglobin (Hb A1C) (Completed)     Nervous and Auditory   MULTIPLE SCLEROSIS    She would like a referral to see Dr. Rachel Moulds.      Relevant Orders   Ambulatory referral to Neurology     Other   Obesity (BMI 30-39.9) (Chronic)    We discussed the possibility of Wegovy or Saxenda.  We will write down the names of his medications and she can check with insurance to see if they are covered.  Contrave may be a secondary option that is not a GLP-1 as well.      Anxiety with depression    Well with sertraline.  Would like to continue with current regimen.  PHQ-9 and GAD-7 look great.       Return in about 6 months (around 07/28/2022) for Hypertension.    Beatrice Lecher, MD

## 2022-01-25 NOTE — Addendum Note (Signed)
Addended by: Darleen Crocker on: 01/25/2022 12:03 PM   Modules accepted: Orders

## 2022-01-27 ENCOUNTER — Telehealth: Payer: Self-pay

## 2022-01-27 DIAGNOSIS — Z5181 Encounter for therapeutic drug level monitoring: Secondary | ICD-10-CM

## 2022-01-27 DIAGNOSIS — R4789 Other speech disturbances: Secondary | ICD-10-CM

## 2022-01-27 DIAGNOSIS — G4733 Obstructive sleep apnea (adult) (pediatric): Secondary | ICD-10-CM

## 2022-01-27 NOTE — Telephone Encounter (Signed)
We received a new referral for this patient, requesting that her care be transferred from Dr Dohmeier to Dr Felecia Shelling. She was last seen by Dr Brett Fairy 07/2021 for relapsing MS and memory concerns.  Can you please advise if this switch is acceptable?  Thanks!

## 2022-01-28 NOTE — Telephone Encounter (Signed)
Absolutely , from my view- she can see Dr Felecia Shelling ! Thanks for asking, Larey Seat, MD

## 2022-01-31 ENCOUNTER — Other Ambulatory Visit: Payer: Self-pay | Admitting: Family Medicine

## 2022-01-31 DIAGNOSIS — I10 Essential (primary) hypertension: Secondary | ICD-10-CM

## 2022-01-31 DIAGNOSIS — F418 Other specified anxiety disorders: Secondary | ICD-10-CM

## 2022-02-12 ENCOUNTER — Other Ambulatory Visit: Payer: Self-pay | Admitting: Family Medicine

## 2022-03-01 ENCOUNTER — Other Ambulatory Visit: Payer: Self-pay | Admitting: Neurology

## 2022-03-01 MED ORDER — AMPHETAMINE-DEXTROAMPHET ER 30 MG PO CP24: 30.0000 mg | ORAL_CAPSULE | Freq: Every day | ORAL | 0 refills | Status: DC

## 2022-03-01 NOTE — Telephone Encounter (Signed)
Pt request refill for amphetamine-dextroamphetamine (ADDERALL XR) 30 MG 24 hr capsule at [id number]

## 2022-03-02 ENCOUNTER — Other Ambulatory Visit: Payer: Self-pay | Admitting: Neurology

## 2022-03-02 DIAGNOSIS — G4733 Obstructive sleep apnea (adult) (pediatric): Secondary | ICD-10-CM

## 2022-03-02 NOTE — Telephone Encounter (Signed)
Order placed for the patient through Dr Felecia Shelling

## 2022-03-02 NOTE — Addendum Note (Signed)
Addended by: Darleen Crocker on: 03/02/2022 01:31 PM   Modules accepted: Orders

## 2022-03-02 NOTE — Telephone Encounter (Signed)
Spoke with patient, she advised that she does not need a f/u right now but does want to see Dr Alyssa Bass when she needs one. She mentioned needing to schedule her HST so she was transferred to [facility] in the sleep lab

## 2022-03-09 NOTE — Telephone Encounter (Signed)
HST- [employer] no Josem Kaufmann req spoke to [place] ref # [id number].  Patient is scheduled at [facility] for 04/05/22 at 9:30 Am.  Mailed packet to the patient.

## 2022-04-05 ENCOUNTER — Ambulatory Visit: Payer: 59 | Admitting: Neurology

## 2022-04-05 DIAGNOSIS — G4714 Hypersomnia due to medical condition: Secondary | ICD-10-CM

## 2022-04-05 DIAGNOSIS — G4733 Obstructive sleep apnea (adult) (pediatric): Secondary | ICD-10-CM

## 2022-04-05 DIAGNOSIS — E669 Obesity, unspecified: Secondary | ICD-10-CM

## 2022-04-05 DIAGNOSIS — E348 Other specified endocrine disorders: Secondary | ICD-10-CM

## 2022-04-05 DIAGNOSIS — G35 Multiple sclerosis: Secondary | ICD-10-CM

## 2022-04-05 LAB — HM MAMMOGRAPHY

## 2022-04-07 NOTE — Progress Notes (Signed)
[facility] TEST REPORT ( by Watch PAT)   STUDY DATA:  04-07-2022    MRN: [id number]   ORDERING CLINICIAN: Larey Seat, MD  REFERRING CLINICIAN: Dr. Madilyn Fireman   CLINICAL INFORMATION/HISTORY: Mrs. Alyssa Bass presented on 08-02-2021 for a memory and obstructive sleep apnea follow-up she had reported sleepiness while showing good compliance on CPAP.  She also was tested by a [place] cognitive assessment test her result was 29 out of 30 points.  She has reported head-nodding tremor.  She is on Tecfidera for the treatment of MS.  Her last CPAP machine was issued on 11-06-2018.  She feels sometimes more fatigued than actually sleepy.  She feels forgetful.  She is a former patient of Dr. Morene Antu.      Epworth sleepiness score: 3 /24.   BMI: 40 kg/m   Neck Circumference: 17 inches   FINDINGS:   Sleep Summary:   Total Recording Time (hours, min):    Total recording time amounts to 9 hours 45 minutes in the total sleep time to 8 hours 42 minutes          Percent REM (%):    43.4%     REM latency was 159 minutes, sleep latency was 19 minutes                               Respiratory Indices:   Calculated pAHI (per hour):     36.7/h                        REM pAHI: 60.3/h                                               NREM pAHI:   18.2/h                           Positional AHI:    All sleep recorded was apparently in supine position with an AHI of 36.7/h. Snoring reached a mean volume of 41 dB which is mild and accompanied roughly half of all recorded sleep time.                                               Oxygen Saturation Statistics:   Oxygen Saturation range:     Between a nadir at 86 % and a maximum of 98% with a mean saturation of 92%                                  O2 Saturation (minutes) <89%:   1.4 minutes        Pulse Rate Statistics:   Pulse Mean (bpm):   82 bpm              Pulse Range:   Between 52 and a maximum of 122  bpm.  Please note that this home sleep test cannot provide data about cardiac rhythm.              IMPRESSION:  This HST  confirms the presence of severe sleep apnea strongly REM dependent, and an amazingly high proportion of REM sleep, uninterrupted between 3:20 AM and 6 AM.    RECOMMENDATION: I agree with the continuation of positive airway pressure therapy being necessary for a apnea of the severity and for the REM dependent part of it.  Such apnea does not respond to a dental device or an inspire device. I recommend an auto titration device by ResMed with a setting between 5 and 16 cmH2O with an EPR of 3.  The patient has used nasal pillows in the past.   I have doubts about the amount of REM sleep but the data here indicate to be present. 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     INTERPRETING PHYSICIAN:   Larey Seat, MD   Medical Director of [facility] at [facility].

## 2022-04-10 ENCOUNTER — Telehealth: Payer: Self-pay | Admitting: Neurology

## 2022-04-10 DIAGNOSIS — G4714 Hypersomnia due to medical condition: Secondary | ICD-10-CM

## 2022-04-10 DIAGNOSIS — E669 Obesity, unspecified: Secondary | ICD-10-CM

## 2022-04-10 DIAGNOSIS — G35 Multiple sclerosis: Secondary | ICD-10-CM

## 2022-04-10 DIAGNOSIS — G4733 Obstructive sleep apnea (adult) (pediatric): Secondary | ICD-10-CM

## 2022-04-10 DIAGNOSIS — E348 Other specified endocrine disorders: Secondary | ICD-10-CM

## 2022-04-10 NOTE — Telephone Encounter (Signed)
[facility] TEST REPORT ( by Watch PAT)   STUDY DATA:  04-07-2022    MRN: [id number]   ORDERING CLINICIAN: Larey Seat, MD  REFERRING CLINICIAN: Dr. Madilyn Fireman   CLINICAL INFORMATION/HISTORY: Mrs. Alyssa Bass presented on 08-02-2021 for a memory and "obstructive sleep apnea "follow-up she had reported sleepiness while showing good compliance on CPAP. Her OSA was followed at [facility].  She today was tested by a [place] cognitive assessment test her result was 29 / 30 points.  She has reported head-nodding tremor.  She is on Tecfidera for the treatment of RR MS.  Her last CPAP machine was issued on 11-06-2018.  She feels sometimes more fatigued than actually sleepy.  She feels forgetful.  She is a former patient of Dr. Morene Antu.      Epworth sleepiness score: 3 /24.   BMI: 40 kg/m   Neck Circumference: 17 inches   FINDINGS:   Sleep Summary:   Total Recording Time (hours, min):    Total recording time amounts to 9 hours 45 minutes in the total sleep time to 8 hours 42 minutes          Percent REM (%):    43.4%     REM latency was 159 minutes, sleep latency was 19 minutes                               Respiratory Indices:   Calculated pAHI (per hour):     36.7/h                        REM pAHI: 60.3/h                                               NREM pAHI:   18.2/h                           Positional AHI:    All sleep recorded was apparently in supine position with an AHI of 36.7/h. Snoring reached a mean volume of 41 dB which is mild and accompanied roughly half of all recorded sleep time.                                               Oxygen Saturation Statistics:   Oxygen Saturation range:     Between a nadir at 86 % and a maximum of 98% with a mean saturation of 92%                                  O2 Saturation (minutes) <89%:   1.4 minutes        Pulse Rate Statistics:   Pulse Mean (bpm):   82 bpm              Pulse Range:   Between  52 and a maximum of 122 bpm.  Please note that this home sleep test cannot provide data about cardiac rhythm.              IMPRESSION:  This HST confirms the presence of severe sleep apnea strongly REM dependent, and an amazingly high proportion of REM sleep, uninterrupted between 3:20 AM and 6 AM.    RECOMMENDATION: I agree with the continuation of positive airway pressure therapy being necessary for a apnea of the severity and for the REM dependent part of it.  Such apnea does not respond to a dental device or an inspire device. I recommend an auto titration device by ResMed with a setting between 5 and 16 cmH2O with an EPR of 3.  The patient has used nasal pillows in the past.   I have doubts about the amount of REM sleep but the data here indicate to be present. 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     INTERPRETING PHYSICIAN:   Larey Seat, MD   Medical Director of [facility] at [facility].

## 2022-04-10 NOTE — Procedures (Signed)
[facility] TEST REPORT ( by Watch PAT)   STUDY DATA:  04-07-2022    MRN: [id number]   ORDERING CLINICIAN: Larey Seat, MD  REFERRING CLINICIAN: Dr. Madilyn Fireman   CLINICAL INFORMATION/HISTORY: Mrs. Alyssa Bass presented on 08-02-2021 for a memory and obstructive sleep apnea follow-up she had reported sleepiness while showing good compliance on CPAP.  She also was tested by a [place] cognitive assessment test her result was 29 out of 30 points.  She has reported head-nodding tremor.  She is on Tecfidera for the treatment of MS.  Her last CPAP machine was issued on 11-06-2018.  She feels sometimes more fatigued than actually sleepy.  She feels forgetful.  She is a former patient of Dr. Morene Antu.      Epworth sleepiness score: 3 /24.   BMI: 40 kg/m   Neck Circumference: 17 inches   FINDINGS:   Sleep Summary:   Total Recording Time (hours, min):    Total recording time amounts to 9 hours 45 minutes in the total sleep time to 8 hours 42 minutes          Percent REM (%):    43.4%     REM latency was 159 minutes, sleep latency was 19 minutes                               Respiratory Indices:   Calculated pAHI (per hour):     36.7/h                        REM pAHI: 60.3/h                                               NREM pAHI:   18.2/h                           Positional AHI:    All sleep recorded was apparently in supine position with an AHI of 36.7/h. Snoring reached a mean volume of 41 dB which is mild and accompanied roughly half of all recorded sleep time.                                               Oxygen Saturation Statistics:   Oxygen Saturation range:     Between a nadir at 86 % and a maximum of 98% with a mean saturation of 92%                                  O2 Saturation (minutes) <89%:   1.4 minutes        Pulse Rate Statistics:   Pulse Mean (bpm):   82 bpm              Pulse Range:   Between 52 and a maximum of 122 bpm.   Please note that this home sleep test cannot provide data about cardiac rhythm.              IMPRESSION:  This HST confirms the presence of  severe sleep apnea strongly REM dependent, and an amazingly high proportion of REM sleep, uninterrupted between 3:20 AM and 6 AM.    RECOMMENDATION: I agree with the continuation of positive airway pressure therapy being necessary for a apnea of the severity and for the REM dependent part of it.  Such apnea does not respond to a dental device or an inspire device. I recommend an auto titration device by ResMed with a setting between 5 and 16 cmH2O with an EPR of 3.  If her current CPAP machine is too recent to be replaced, please consider to reset. The patient has used nasal pillows in the past.   I have doubts about the amount of REM sleep but the data here indicate to be present. 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     INTERPRETING PHYSICIAN:   Larey Seat, MD   Medical Director of [facility] at [facility].

## 2022-04-11 NOTE — Telephone Encounter (Signed)
Called patient to discuss sleep study results. No answer at this time. LVM for the patient to call back.   

## 2022-04-13 NOTE — Telephone Encounter (Signed)
Called a 2nd time but there was no answer and this time no VM picked up. Will try again later

## 2022-04-18 ENCOUNTER — Other Ambulatory Visit: Payer: Self-pay | Admitting: *Deleted

## 2022-04-18 ENCOUNTER — Telehealth: Payer: Self-pay | Admitting: Neurology

## 2022-04-18 MED ORDER — AMPHETAMINE-DEXTROAMPHET ER 30 MG PO CP24: 30.0000 mg | ORAL_CAPSULE | Freq: Every day | ORAL | 0 refills | Status: DC

## 2022-04-18 NOTE — Telephone Encounter (Signed)
Pt last saw Dr. Brett Fairy 08/02/21. Told to f/u in 1 yr. However, looks like she will follow w/ Dr. Felecia Shelling (had TOC). No f/u scheduled.  Per drug registry, last refilled 03/01/22 #30. Will send to Dr. Brett Fairy to e-scribe.

## 2022-04-18 NOTE — Telephone Encounter (Signed)
Pt has been scheduled for 08-24-22, this is FYI for [place], [place]

## 2022-04-18 NOTE — Telephone Encounter (Signed)
Pt is requesting a refill for amphetamine-dextroamphetamine (ADDERALL XR) 30 MG 24 hr capsule.  [id number]

## 2022-04-18 NOTE — Telephone Encounter (Signed)
Called the patient back and reviewed the sleep study results. Advised the patient that Dr Brett Fairy and Dr Felecia Shelling reviewed the sleep study and moderate the severe osa still remains. Advised that she should continue CPAP. Looks like the patient is set up on a machine that is a card DL and was set up around 10/2018. If that is the case, she is not eligible for a new machine at this time. The current machine was set at a pressure. I will have to check with [employer] in regards to whether her machine has auto cpap capability to determine if they can offer. Advised at he follow up apt scheduled with Dr Felecia Shelling patient must bring machine/power cord or the Card with her. Pt verbalized understanding.

## 2022-05-19 ENCOUNTER — Other Ambulatory Visit: Payer: Self-pay | Admitting: Neurology

## 2022-05-19 ENCOUNTER — Telehealth: Payer: Self-pay | Admitting: *Deleted

## 2022-05-19 MED ORDER — AMPHETAMINE-DEXTROAMPHET ER 30 MG PO CP24: 30.0000 mg | ORAL_CAPSULE | Freq: Every day | ORAL | 0 refills | Status: DC

## 2022-05-19 NOTE — Telephone Encounter (Signed)
Pt is needing a refill request for her amphetamine-dextroamphetamine (ADDERALL XR) 30 MG 24 hr capsule [facility] [address].

## 2022-05-19 NOTE — Telephone Encounter (Signed)
Pt reports that she was seen in May about some joint swelling at that time she had brought in some xrays that had been done at [place] and they Dx her with possible OA. She said that her finger is starting to turn in and it hurts to bend it.   Pt advised that I will need to speak to Dr. Madilyn Bass to see if this should be referred to Rheumatology or if she could f/u with our sports medicine doctor Dr. Dianah Bass.  Spoke w/Dr. Madilyn Bass she stated that because she doesn't have an autoimmune dz rheumatology will not see her she can either go back to [place] or she can see Dr. Darene Bass.   I called her back and LVM informing her of this.

## 2022-06-14 ENCOUNTER — Telehealth: Payer: Self-pay | Admitting: *Deleted

## 2022-06-14 NOTE — Telephone Encounter (Signed)
Faxed completed/signed [place] dimethyl fumarate to [facility] at [phone]. Received fax confirmation. Waiting on determination.

## 2022-06-24 ENCOUNTER — Ambulatory Visit (INDEPENDENT_AMBULATORY_CARE_PROVIDER_SITE_OTHER): Payer: 59 | Admitting: Sports Medicine

## 2022-06-24 ENCOUNTER — Ambulatory Visit (INDEPENDENT_AMBULATORY_CARE_PROVIDER_SITE_OTHER): Payer: 59

## 2022-06-24 ENCOUNTER — Encounter: Payer: Self-pay | Admitting: Sports Medicine

## 2022-06-24 DIAGNOSIS — M19042 Primary osteoarthritis, left hand: Secondary | ICD-10-CM | POA: Diagnosis not present

## 2022-06-24 DIAGNOSIS — M19041 Primary osteoarthritis, right hand: Secondary | ICD-10-CM | POA: Diagnosis not present

## 2022-06-24 MED ORDER — CELECOXIB 200 MG PO CAPS: ORAL_CAPSULE | ORAL | 2 refills | Status: DC

## 2022-06-24 MED ORDER — PREDNISONE 50 MG PO TABS: ORAL_TABLET | ORAL | 0 refills | Status: DC

## 2022-06-24 NOTE — Progress Notes (Signed)
    Procedures performed today:    None.  Independent interpretation of notes and tests performed by another provider:   None.  Brief History, Exam, Impression, and Recommendations:    Primary osteoarthritis of both hands This is a very pleasant 56 year old female with history of multiple sclerosis, she has been having increasing pain both hands, predominately right-sided at the thumb basal joint as well as third PIP. Moderate stiffness, swelling. Denies any numbness or tingling, she has a negative Finkelstein sign and tenderness at the above-named joints. Adding 5 days of prednisone, Celebrex, home physical therapy, bilateral hand x-rays. Return to see me in 6 weeks, injections if not better.    ____________________________________________ Gwen Her. Dianah Field, M.D., ABFM., CAQSM., AME. Primary Care and Sports Medicine [facility]  Adjunct Professor of [employer] Medicine  Risk manager

## 2022-06-24 NOTE — Assessment & Plan Note (Signed)
This is a very pleasant 56 year old female with history of multiple sclerosis, she has been having increasing pain both hands, predominately right-sided at the thumb basal joint as well as third PIP. Moderate stiffness, swelling. Denies any numbness or tingling, she has a negative Finkelstein sign and tenderness at the above-named joints. Adding 5 days of prednisone, Celebrex, home physical therapy, bilateral hand x-rays. Return to see me in 6 weeks, injections if not better.

## 2022-06-29 ENCOUNTER — Other Ambulatory Visit: Payer: Self-pay | Admitting: Neurology

## 2022-06-29 MED ORDER — AMPHETAMINE-DEXTROAMPHET ER 30 MG PO CP24: 30.0000 mg | ORAL_CAPSULE | Freq: Every day | ORAL | 0 refills | Status: DC

## 2022-06-29 NOTE — Telephone Encounter (Signed)
Last seen 08/02/21 and next f/u 08/24/22. Per drug registry, last refilled 05/19/22 #30.

## 2022-06-29 NOTE — Telephone Encounter (Signed)
Pt is needing a refill of her amphetamine-dextroamphetamine (ADDERALL XR) 30 MG 24 hr capsule [facility] [address].

## 2022-06-29 NOTE — Telephone Encounter (Signed)
Called [facility] at  [phone]. Automated system states [place] approved 05/15/22-06/14/23.

## 2022-07-21 ENCOUNTER — Other Ambulatory Visit: Payer: Self-pay | Admitting: Neurology

## 2022-07-25 ENCOUNTER — Other Ambulatory Visit: Payer: Self-pay | Admitting: Neurology

## 2022-08-02 ENCOUNTER — Encounter: Payer: Self-pay | Admitting: Family Medicine

## 2022-08-02 ENCOUNTER — Ambulatory Visit: Payer: Managed Care, Other (non HMO) | Admitting: Family Medicine

## 2022-08-02 ENCOUNTER — Other Ambulatory Visit: Payer: Self-pay | Admitting: Neurology

## 2022-08-02 VITALS — BP 143/74 | HR 87 | Ht 63.0 in | Wt 225.0 lb

## 2022-08-02 DIAGNOSIS — R233 Spontaneous ecchymoses: Secondary | ICD-10-CM

## 2022-08-02 DIAGNOSIS — R899 Unspecified abnormal finding in specimens from other organs, systems and tissues: Secondary | ICD-10-CM

## 2022-08-02 DIAGNOSIS — F418 Other specified anxiety disorders: Secondary | ICD-10-CM | POA: Diagnosis not present

## 2022-08-02 DIAGNOSIS — I10 Essential (primary) hypertension: Secondary | ICD-10-CM | POA: Diagnosis not present

## 2022-08-02 DIAGNOSIS — E785 Hyperlipidemia, unspecified: Secondary | ICD-10-CM

## 2022-08-02 DIAGNOSIS — R7301 Impaired fasting glucose: Secondary | ICD-10-CM | POA: Diagnosis not present

## 2022-08-02 MED ORDER — BUPROPION HCL ER (XL) 150 MG PO TB24: 150.0000 mg | ORAL_TABLET | Freq: Every day | ORAL | 1 refills | Status: DC

## 2022-08-02 MED ORDER — SERTRALINE HCL 100 MG PO TABS: 200.0000 mg | ORAL_TABLET | Freq: Every day | ORAL | 1 refills | Status: DC

## 2022-08-02 MED ORDER — AMPHETAMINE-DEXTROAMPHET ER 30 MG PO CP24: 30.0000 mg | ORAL_CAPSULE | Freq: Every day | ORAL | 0 refills | Status: DC

## 2022-08-02 NOTE — Telephone Encounter (Signed)
Pt is calling. Requesting a refill on amphetamine-dextroamphetamine (ADDERALL XR) 30 MG 24 hr capsule. [id number] [place]

## 2022-08-02 NOTE — Assessment & Plan Note (Signed)
Will increase sertraline to 200 mg.  Add Wellbutrin low-dose.  Follow-up in 4 to 6 weeks.  Will also place referral for therapy/counseling.

## 2022-08-02 NOTE — Assessment & Plan Note (Signed)
Lab Results  Component Value Date   HGBA1C 5.2 01/25/2022   Will check A1c on blood work today.

## 2022-08-02 NOTE — Progress Notes (Signed)
Established Patient Office Visit  Subjective   Patient ID: Alyssa Bass, female    DOB: 09-11-1965  Age: 57 y.o. MRN: [id number]  Chief Complaint  Patient presents with   Hypertension    HPI Hypertension- Pt denies chest pain, SOB, dizziness, or heart palpitations.  Taking meds as directed w/o problems.  Denies medication side effects.    Impaired fasting glucose-no increased thirst or urination. No symptoms consistent with hypoglycemia.  F/U Anxiety with depression  - On zoloft.  She feels like it is not working well. Has been on 150 for about a year. Previously on '200mg'$ .  Has been more down and tearful for about 6 mo. he has this feeling of just having not taking care of herself and really wants to get back on track.  She is open to therapy/counseling.  She is also noticed some easy bruising particularly on her forearms and hands over the about the last 6 months as well.  Does have a neurology appointment coming up with Dr. Rachel Moulds for her MS but she is also going to address the head tremor with him as well.    ROS    Objective:     BP (!) 143/74   Pulse 87   Ht '5\' 3"'$  (1.6 m)   Wt 225 lb (102.1 kg)   LMP 12/16/2013   SpO2 93%   BMI 39.86 kg/m    Physical Exam Vitals and nursing note reviewed.  Constitutional:      Appearance: She is well-developed.  HENT:     Head: Normocephalic and atraumatic.  Cardiovascular:     Rate and Rhythm: Normal rate and regular rhythm.     Heart sounds: Normal heart sounds.  Pulmonary:     Effort: Pulmonary effort is normal.     Breath sounds: Normal breath sounds.  Skin:    General: Skin is warm and dry.  Neurological:     Mental Status: She is alert and oriented to person, place, and time.  Psychiatric:        Behavior: Behavior normal.     No results found for any visits on 08/02/22.    The 10-year ASCVD risk score (Arnett DK, et al., 2019) is: 2.7%    Assessment & Plan:   Problem List Items Addressed This Visit        Cardiovascular and Mediastinum   HYPERTENSION, BENIGN - Primary    Well controlled. Continue current regimen. Follow up in  6 mo       Relevant Orders   Lipid Panel w/reflex Direct LDL   COMPLETE METABOLIC PANEL WITH GFR   CBC   Hemoglobin A1c     Endocrine   IFG (impaired fasting glucose)    Lab Results  Component Value Date   HGBA1C 5.2 01/25/2022  Will check A1c on blood work today.      Relevant Orders   Lipid Panel w/reflex Direct LDL   COMPLETE METABOLIC PANEL WITH GFR   CBC   Hemoglobin A1c   TSH   Sedimentation rate   C-reactive protein   INR/PT     Other   Hyperlipidemia   Relevant Orders   Lipid Panel w/reflex Direct LDL   COMPLETE METABOLIC PANEL WITH GFR   CBC   Hemoglobin A1c   TSH   Sedimentation rate   C-reactive protein   INR/PT   Anxiety with depression    Will increase sertraline to 200 mg.  Add Wellbutrin low-dose.  Follow-up in 4 to 6  weeks.  Will also place referral for therapy/counseling.      Relevant Medications   buPROPion (WELLBUTRIN XL) 150 MG 24 hr tablet   sertraline (ZOLOFT) 100 MG tablet   Other Relevant Orders   Lipid Panel w/reflex Direct LDL   COMPLETE METABOLIC PANEL WITH GFR   CBC   Hemoglobin A1c   Ambulatory referral to Behavioral Health   Other Visit Diagnoses     Easy bruising       Relevant Orders   TSH   Sedimentation rate   C-reactive protein   INR/PT      Easy bruising-unclear etiology.  Will check liver function, PT/INR, CBC.  No new medications that should be causing it or blood thinners.  Return in about 6 weeks (around 09/13/2022) for New start medication.    Beatrice Lecher, MD

## 2022-08-02 NOTE — Assessment & Plan Note (Signed)
Well controlled. Continue current regimen. Follow up in  6 mo  

## 2022-08-03 LAB — COMPLETE METABOLIC PANEL WITH GFR
AG Ratio: 1.7 (calc) (ref 1.0–2.5)
ALT: 42 U/L — ABNORMAL HIGH (ref 6–29)
AST: 15 U/L (ref 10–35)
Albumin: 4.7 g/dL (ref 3.6–5.1)
Alkaline phosphatase (APISO): 59 U/L (ref 37–153)
BUN: 21 mg/dL (ref 7–25)
CO2: 24 mmol/L (ref 20–32)
Calcium: 9.6 mg/dL (ref 8.6–10.4)
Chloride: 105 mmol/L (ref 98–110)
Creat: 0.71 mg/dL (ref 0.50–1.03)
Globulin: 2.8 g/dL (calc) (ref 1.9–3.7)
Glucose, Bld: 102 mg/dL — ABNORMAL HIGH (ref 65–99)
Potassium: 4.2 mmol/L (ref 3.5–5.3)
Sodium: 140 mmol/L (ref 135–146)
Total Bilirubin: 0.5 mg/dL (ref 0.2–1.2)
Total Protein: 7.5 g/dL (ref 6.1–8.1)
eGFR: 101 mL/min/{1.73_m2} (ref >=60)

## 2022-08-03 LAB — TSH: TSH: 1.18 mIU/L

## 2022-08-03 LAB — HEMOGLOBIN A1C
Hgb A1c MFr Bld: 5.6 % of total Hgb (ref <5.7)
Mean Plasma Glucose: 114 mg/dL
eAG (mmol/L): 6.3 mmol/L

## 2022-08-03 LAB — LIPID PANEL W/REFLEX DIRECT LDL
Cholesterol: 199 mg/dL (ref <200)
HDL: 59 mg/dL (ref >=50)
LDL Cholesterol (Calc): 120 mg/dL (calc) — ABNORMAL HIGH
Non-HDL Cholesterol (Calc): 140 mg/dL (calc) — ABNORMAL HIGH (ref <130)
Total CHOL/HDL Ratio: 3.4 (calc) (ref <5.0)
Triglycerides: 101 mg/dL (ref <150)

## 2022-08-03 LAB — CBC
HCT: 40.5 % (ref 35.0–45.0)
Hemoglobin: 13.9 g/dL (ref 11.7–15.5)
MCH: 32.1 pg (ref 27.0–33.0)
MCHC: 34.3 g/dL (ref 32.0–36.0)
MCV: 93.5 fL (ref 80.0–100.0)
MPV: 11.1 fL (ref 7.5–12.5)
Platelets: 289 10*3/uL (ref 140–400)
RBC: 4.33 10*6/uL (ref 3.80–5.10)
RDW: 12.2 % (ref 11.0–15.0)
WBC: 4.4 10*3/uL (ref 3.8–10.8)

## 2022-08-03 LAB — SEDIMENTATION RATE: Sed Rate: 9 mm/h (ref 0–30)

## 2022-08-03 LAB — PROTIME-INR
INR: 1
Prothrombin Time: 10.5 s (ref 9.0–11.5)

## 2022-08-03 LAB — C-REACTIVE PROTEIN: CRP: 4.7 mg/L (ref <8.0)

## 2022-08-05 ENCOUNTER — Ambulatory Visit (INDEPENDENT_AMBULATORY_CARE_PROVIDER_SITE_OTHER): Payer: Managed Care, Other (non HMO)

## 2022-08-05 ENCOUNTER — Encounter: Payer: Self-pay | Admitting: Family Medicine

## 2022-08-05 ENCOUNTER — Ambulatory Visit: Payer: Managed Care, Other (non HMO) | Admitting: Sports Medicine

## 2022-08-05 ENCOUNTER — Other Ambulatory Visit: Payer: Self-pay

## 2022-08-05 DIAGNOSIS — M19042 Primary osteoarthritis, left hand: Secondary | ICD-10-CM

## 2022-08-05 DIAGNOSIS — M19041 Primary osteoarthritis, right hand: Secondary | ICD-10-CM

## 2022-08-05 DIAGNOSIS — R899 Unspecified abnormal finding in specimens from other organs, systems and tissues: Secondary | ICD-10-CM

## 2022-08-05 MED ORDER — TRIAMCINOLONE ACETONIDE 40 MG/ML IJ SUSP
40.0000 mg | Freq: Once | INTRAMUSCULAR | Status: AC
  Administered 2022-08-05: 40 mg via INTRAMUSCULAR

## 2022-08-05 NOTE — Progress Notes (Signed)
    Procedures performed today:    Procedure: Real-time Ultrasound Guided injection of the right third PIP Device: Samsung HS60  Verbal informed consent obtained.  Time-out conducted.  Noted no overlying erythema, induration, or other signs of local infection.  Skin prepped in a sterile fashion.  Local anesthesia: Topical Ethyl chloride.  With sterile technique and under real time ultrasound guidance: Arthritic joint and synovitis noted, 1/2 cc lidocaine, 1/2 cc kenalog 40 injected easily. Completed without difficulty  Advised to call if fevers/chills, erythema, induration, drainage, or persistent bleeding.  Images permanently stored and available for review in PACS.  Impression: Technically successful ultrasound guided injection.  Independent interpretation of notes and tests performed by another provider:   None.  Brief History, Exam, Impression, and Recommendations:    Primary osteoarthritis of both hands This is a very pleasant 57 year old female, history of multiple sclerosis, bilateral hand pain, the majority of her pain is right-sided third PIP. She did have a negative rheumatoid arthritis test, CCP in May of last year. I got x-rays at the last visit, we started Celebrex after 5 days of prednisone, home physical therapy. X-rays did show widespread osteoarthritis. Unfortunately she continues to have a similar pain. Today we did a right third PIP injection. Return to see me in 6 weeks.    ____________________________________________ Gwen Her. Dianah Field, M.D., ABFM., CAQSM., AME. Primary Care and Sports Medicine [facility]  Adjunct Professor of [employer]  Risk manager

## 2022-08-05 NOTE — Progress Notes (Signed)
Hi Alyssa Bass, LDL cholesterol looks a little better this year it is 120 which is still up from from the normal range but it is an improvement over last year.  Great work!  Your ALT liver enzyme is just slightly elevated.  The AST liver enzyme is in the normal range.  I would like to recheck this in about 3 to 4 weeks.  Your blood count looks great.  No anemia.  A1c looks good at 5.6.  Your thyroid is normal.  PT/INR is also normal.  CRP is in the normal range as well.  This evaluates for inflammation.

## 2022-08-05 NOTE — Assessment & Plan Note (Addendum)
This is a very pleasant 57 year old female, history of multiple sclerosis, bilateral hand pain, the majority of her pain is right-sided third PIP. She did have a negative rheumatoid arthritis test, CCP in May of last year. I got x-rays at the last visit, we started Celebrex after 5 days of prednisone, home physical therapy. X-rays did show widespread osteoarthritis. Unfortunately she continues to have a similar pain. Today we did a right third PIP injection. Return to see me in 6 weeks.

## 2022-08-05 NOTE — Progress Notes (Signed)
Labs ordered per previous result note.

## 2022-08-05 NOTE — Addendum Note (Signed)
Addended by: Peggye Ley on: 08/05/2022 11:03 AM   Modules accepted: Orders

## 2022-08-24 ENCOUNTER — Ambulatory Visit: Payer: Managed Care, Other (non HMO) | Admitting: Neurology

## 2022-08-24 ENCOUNTER — Encounter: Payer: Self-pay | Admitting: Neurology

## 2022-08-24 VITALS — BP 165/103 | HR 91 | Ht 64.0 in | Wt 222.0 lb

## 2022-08-24 DIAGNOSIS — Z79899 Other long term (current) drug therapy: Secondary | ICD-10-CM | POA: Diagnosis not present

## 2022-08-24 DIAGNOSIS — G4733 Obstructive sleep apnea (adult) (pediatric): Secondary | ICD-10-CM | POA: Diagnosis not present

## 2022-08-24 DIAGNOSIS — Q046 Congenital cerebral cysts: Secondary | ICD-10-CM | POA: Diagnosis not present

## 2022-08-24 DIAGNOSIS — G35 Multiple sclerosis: Secondary | ICD-10-CM | POA: Diagnosis not present

## 2022-08-24 DIAGNOSIS — G250 Essential tremor: Secondary | ICD-10-CM

## 2022-08-24 MED ORDER — PROPRANOLOL HCL ER 60 MG PO CP24: 60.0000 mg | ORAL_CAPSULE | Freq: Every day | ORAL | 11 refills | Status: DC

## 2022-08-24 NOTE — Progress Notes (Signed)
[facility]  PATIENT: Alyssa Bass DOB: [birth year]  REFERRING DOCTOR OR PCP: Beatrice Lecher MD SOURCE: Patient, notes from neurology, imaging and lab reports, MRI images personally reviewed.  _________________________________   HISTORICAL  CHIEF COMPLAINT:  Chief Complaint  Patient presents with   Room 11    Pt is here Alone.Pt states that her head shakes. Pt states that her finger tips on her right hand a numb. Pt states that her memory has been effected, Pt states that everything has been going good with her CPAP Machine.     HISTORY OF PRESENT ILLNESS:  I had the pleasure of seeing your patient, Alyssa Bass, at the [place] [facility] for neurologic consultation regarding her multiple sclerosis, sleep apnea third ventricle colloid cyst  Her multiple sclerosis is stable.  She has been on Tecfidera for several years and tolerates it well.  She denies any recent exacerbation.  However, she has noted a tremor in her head since mid 2023.   I noticed a tremor in her hands though she had not noted (better in right dominant, worse in left).    She notes the head tremor most while using the computer.   She has no family history of tremor.  She is not having any change in her gait.  She notes the tremor most in her head when she is using her computer and in the hand when she is putting on make-up.    Currently, gait and balance are doing well.  She does not need to use the bands during stairs.  She has not had any falls.  She can walk several miles without a break.  She denies any significant numbness or weakness currently.  Patient has done well though she had optic neuritis initially.  She denies any difficulty with her bladder.  She notes some fatigue.  She has insomnia and takes delta-8 at night.   She notes no change in cognition recently.  Adderall has helped attention and fatigue.  She is on Zoloft and Wellbutrin for mood.  Besides  the MS, she also has a third ventricle colloid cyst that has been noted on imaging studies.  It looks like it may be very minimally bigger in 2023 versus 2020 by a millimeter though by report and measurements back in 2012 and 2013 it is about the same size then as now.  We do not have imaging before 2022 personally reviewed  She also has OSA and uses CPAP regularly.  Download today showed excellent compliance (97%) and excellent efficacy (0.9/h AHI)   MS History: In 16 (age 77) she had optic neuritis on the left and she received IV solumedrol.   She had a couple spots in the brain and was told she might have MS.   A few years later, she had an LP that showed OCB.    She had an exacerbation with  numbness in arms and legs.  Symptoms improved with steroids   She was placed on Avonex and then Rebif.    She switched to Tecfidera around 2012.     HSe has not had any exacerbation  IMAGING: MRI of the brain 08/19/2021 shows scattered T2/FLAIR hyperintense foci in the periventricular, juxtacortical and deep white matter.  None of the foci enhance or appear to be acute.  No changes compared to 08/11/2018.  There is a third ventricle colloid cyst (hypointense on T2 weighted images and hyperintense on T1-weighted images) measuring 12 x 13 x 11  mm (AP x transverse x craniocaudal).  It is the same size or no more than 1 mm larger compared to the 2020 MRI (but by report the same size as in 2013)  MRI of the cervical spine 08/19/2021 shows 2 T2 hyperintense foci consistent with MS centrally adjacent to C2 and posterolaterally to the right adjacent to C2-C3.  There is a disc protrusion at C5-C6 indenting the thecal sac without causing spinal cord compression.  Neuroforamina are widely patent there is no n   OSA History She was with OSA many years ago and is on her second CPAP machine for the past few years.  Download last year showed AHI=0.2 and 96% compliance.   HST 04/05/2022 showed severe OSA with AHI=36.7 (severe in  REM at 60/h).   She was recmmended to be on APAP 5-16  REVIEW OF SYSTEMS: Constitutional: No fevers, chills, sweats, or change in appetite Eyes: No visual changes, double vision, eye pain Ear, nose and throat: No hearing loss, ear pain, nasal congestion, sore throat Cardiovascular: No chest pain, palpitations Respiratory:  No shortness of breath at rest or with exertion.   No wheezes GastrointestinaI: No nausea, vomiting, diarrhea, abdominal pain, fecal incontinence Genitourinary:  No dysuria, urinary retention or frequency.  No nocturia. Musculoskeletal:  No neck pain, back pain Integumentary: No rash, pruritus, skin lesions Neurological: as above Psychiatric: No depression at this time.  No anxiety Endocrine: No palpitations, diaphoresis, change in appetite, change in weigh or increased thirst Hematologic/Lymphatic:  No anemia, purpura, petechiae. Allergic/Immunologic: No itchy/runny eyes, nasal congestion, recent allergic reactions, rashes  ALLERGIES: No Known Allergies  HOME MEDICATIONS:  Current Outpatient Medications:    amphetamine-dextroamphetamine (ADDERALL XR) 30 MG 24 hr capsule, Take 1 capsule (30 mg total) by mouth daily., Disp: 30 capsule, Rfl: 0   buPROPion (WELLBUTRIN XL) 150 MG 24 hr tablet, Take 1 tablet (150 mg total) by mouth daily., Disp: 90 tablet, Rfl: 1   celecoxib (CELEBREX) 200 MG capsule, One to 2 tablets by mouth daily as needed for pain.  Start after you finish your 5 days of prednisone, Disp: 60 capsule, Rfl: 2   Dimethyl Fumarate 240 MG CPDR, Take 1 capsule (240 mg total) by mouth 2 (two) times daily., Disp: 180 capsule, Rfl: 3   lisinopril-hydrochlorothiazide (ZESTORETIC) 20-12.5 MG tablet, TAKE 1 TABLET DAILY, Disp: 90 tablet, Rfl: 3   propranolol ER (INDERAL LA) 60 MG 24 hr capsule, Take 1 capsule (60 mg total) by mouth daily., Disp: 30 capsule, Rfl: 11   Propylene Glycol (SYSTANE COMPLETE OP), Apply to eye., Disp: , Rfl:    sertraline (ZOLOFT) 100 MG  tablet, Take 2 tablets (200 mg total) by mouth daily., Disp: 180 tablet, Rfl: 1   tretinoin (RETIN-A) 0.025 % cream, Apply topically. 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   Depression    Hypertension    MS (multiple sclerosis) ([place])    Tachycardia     PAST SURGICAL HISTORY: Past Surgical History:  Procedure Laterality Date   ACHILLES TENDON SURGERY     Hagland deformity   COLONOSCOPY W/ POLYPECTOMY     COLONOSCOPY WITH PROPOFOL N/A 05/01/2015   Procedure: COLONOSCOPY WITH PROPOFOL;  Surgeon: Laurence Spates, MD;  Location: [facility];  Service: Endoscopy;  Laterality: N/A;   Left ankle surgery  2004    FAMILY HISTORY: Family History  Problem Relation Age of Onset   Hypertension Mother    Sleep apnea Mother    Cancer Father        pre cancerous colon   Cancer Other        colon   Heart attack Other        grandmother   Hypertension Brother    Sleep apnea Brother    Sleep apnea Sister     SOCIAL HISTORY: Social History   Socioeconomic History   Marital status: Single    Spouse name: Elta [place]   Number of children: 2   Years of education: 14   Highest education level: Not on file  Occupational History    Employer: [employer]    Comment: Statistician  Tobacco Use   Smoking status: Former    Packs/day: 1.00    Types: Cigarettes    Quit date: 08/11/2015    Years since quitting: 7.0   Smokeless tobacco: Never  Substance and Sexual Activity   Alcohol use: Yes    Alcohol/week: 0.0 standard drinks of alcohol    Comment: occas.   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   Patient has two children.   Patient is working full-time.   Patient has a college degree.   Patient is  right-handed.   Patient drinks 2 cups of coffee daily.      Social Determinants of Health   Financial Resource Strain: Not on file  Food Insecurity: Not on file  Transportation Needs: Not on file  Physical Activity: Not on file  Stress: Not on file  Social Connections: Not on file  Intimate Partner Violence: Not on file       PHYSICAL EXAM  Vitals:   08/24/22 1122  BP: (!) 165/103  Pulse: 91  Weight: 222 lb (100.7 kg)  Height: 5' 4"$  (1.626 m)    Body mass index is 38.11 kg/m.   General: The patient is well-developed and well-nourished and in no acute distress  HEENT:  Head is [place]/AT.  Sclera are anicteric.    Neck: No carotid bruits are noted.  The neck is nontender.  Cardiovascular: The heart has a regular rate and rhythm with a normal S1 and S2. There were no murmurs, gallops or rubs.    Skin: Extremities are without rash or  edema.  Musculoskeletal:  Back is nontender  Neurologic Exam  Mental status: The patient is alert and oriented x 3 at the time of the examination. The patient has apparent normal recent and remote memory, with an apparently normal attention span and concentration ability.   Speech is normal.  Cranial nerves: Extraocular movements are full. Pupils are equal, round, and reactive to light and accomodation.  Visual fields are full.  Facial symmetry is present. There is good facial sensation to soft touch bilaterally.Facial strength is normal.  Trapezius and sternocleidomastoid strength is normal. No dysarthria is noted.  The tongue is midline, and the patient has symmetric elevation of the soft palate. No obvious hearing deficits are noted.  Motor:  Muscle bulk is normal.   Tone is normal. Strength is  5 / 5 in all 4 extremities.   Other: She has a 7 Hz eccentric tremor in the head and in the hands, left greater than right  Sensory: Sensory testing is intact  to pinprick, soft touch and vibration sensation in all 4 extremities.  Coordination:  Cerebellar testing reveals good finger-nose-finger and heel-to-shin bilaterally.  Gait and station: Station is normal.   Gait is normal. Tandem gait is mildly wide. Romberg is negative.   Reflexes: Deep tendon reflexes are symmetric and normal bilaterally.   Plantar responses are flexor.    DIAGNOSTIC DATA (LABS, IMAGING, TESTING) - I reviewed patient records, labs, notes, testing and imaging myself where available.  Lab Results  Component Value Date   WBC 4.4 08/02/2022   HGB 13.9 08/02/2022   HCT 40.5 08/02/2022   MCV 93.5 08/02/2022   PLT 289 08/02/2022      Component Value Date/Time   NA 140 08/02/2022 0858   NA 139 07/31/2018 0831   K 4.2 08/02/2022 0858   CL 105 08/02/2022 0858   CO2 24 08/02/2022 0858   GLUCOSE 102 (H) 08/02/2022 0858   BUN 21 08/02/2022 0858   BUN 13 07/31/2018 0831   CREATININE 0.71 08/02/2022 0858   CALCIUM 9.6 08/02/2022 0858   PROT 7.5 08/02/2022 0858   PROT 7.3 07/31/2018 0831   ALBUMIN 4.5 07/31/2018 0831   AST 15 08/02/2022 0858   ALT 42 (H) 08/02/2022 0858   ALKPHOS 54 07/31/2018 0831   BILITOT 0.5 08/02/2022 0858   BILITOT 0.2 07/31/2018 0831   GFRNONAA 95 07/31/2018 0831   GFRNONAA 107 04/16/2018 0756   GFRAA 109 07/31/2018 0831   GFRAA 125 04/16/2018 0756   Lab Results  Component Value Date   CHOL 199 08/02/2022   HDL 59 08/02/2022   LDLCALC 120 (H) 08/02/2022   LDLDIRECT 112 (H) 07/26/2021   TRIG 101 08/02/2022   CHOLHDL 3.4 08/02/2022   Lab Results  Component Value Date   HGBA1C 5.6 08/02/2022   No results found for: "VITAMINB12" Lab Results  Component Value Date   TSH 1.18 08/02/2022       ASSESSMENT AND PLAN  Relapsing remitting multiple sclerosis ([place]) - Plan: CBC with Differential/Platelet, Hepatic Function Panel  High risk medication use - Plan: CBC with Differential/Platelet, Hepatic Function Panel  OSA (obstructive sleep apnea)  Colloid cyst of third ventricle (H[facility])  Benign essential  tremor   Continue dimethyl fumarate for MS.  She has not had any recent exacerbations.  Check labs. She appears to have benign essential tremor.  I will add propranolol LA 60 mg and increase based on response Stay active and exercise as tolerated. We will check an MRI about every 2 years to assess the colloid cyst and her MS. Continue CPAP.  Return in 6 months or sooner if there are new or worsening neurologic symptoms.  45-minute office visit with the majority of the time spent face-to-face for history and physical, discussion/counseling and decision-making.  Additional time with record review and documentation.   Rahkim Rabalais A. Felecia Shelling, MD, [facility] [id number], [id number] PM Certified in Neurology, Clinical Neurophysiology, Sleep Medicine and Neuroimaging  [facility] [address], [place], [place] [address] [phone]

## 2022-08-25 LAB — CBC WITH DIFFERENTIAL/PLATELET
Basophils Absolute: 0 10*3/uL (ref 0.0–0.2)
Basos: 1 %
EOS (ABSOLUTE): 0.1 10*3/uL (ref 0.0–0.4)
Eos: 2 %
Hematocrit: 40.5 % (ref 34.0–46.6)
Hemoglobin: 13.8 g/dL (ref 11.1–15.9)
Immature Grans (Abs): 0 10*3/uL (ref 0.0–0.1)
Immature Granulocytes: 0 %
Lymphocytes Absolute: 1.4 10*3/uL (ref 0.7–3.1)
Lymphs: 24 %
MCH: 31.4 pg (ref 26.6–33.0)
MCHC: 34.1 g/dL (ref 31.5–35.7)
MCV: 92 fL (ref 79–97)
Monocytes Absolute: 0.5 10*3/uL (ref 0.1–0.9)
Monocytes: 8 %
Neutrophils Absolute: 3.9 10*3/uL (ref 1.4–7.0)
Neutrophils: 65 %
Platelets: 266 10*3/uL (ref 150–450)
RBC: 4.4 x10E6/uL (ref 3.77–5.28)
RDW: 12.3 % (ref 11.7–15.4)
WBC: 5.9 10*3/uL (ref 3.4–10.8)

## 2022-08-25 LAB — HEPATIC FUNCTION PANEL
ALT: 17 IU/L (ref 0–32)
AST: 19 IU/L (ref 0–40)
Albumin: 4.8 g/dL (ref 3.8–4.9)
Alkaline Phosphatase: 72 IU/L (ref 44–121)
Bilirubin Total: 0.4 mg/dL (ref 0.0–1.2)
Bilirubin, Direct: 0.14 mg/dL (ref 0.00–0.40)
Total Protein: 7.3 g/dL (ref 6.0–8.5)

## 2022-09-01 ENCOUNTER — Other Ambulatory Visit: Payer: Self-pay | Admitting: Neurology

## 2022-09-01 NOTE — Telephone Encounter (Signed)
Last seen 08/24/22 and next f/u 03/01/23. Per drug registry, last refilled 08/02/22 #30.

## 2022-09-01 NOTE — Telephone Encounter (Signed)
Pt is calling. Requesting a refill on amphetamine-dextroamphetamine (ADDERALL XR) 30 MG 24 hr capsule. Should be sent to [facility] #[id number]

## 2022-09-02 MED ORDER — AMPHETAMINE-DEXTROAMPHET ER 30 MG PO CP24: 30.0000 mg | ORAL_CAPSULE | Freq: Every day | ORAL | 0 refills | Status: DC

## 2022-09-14 ENCOUNTER — Ambulatory Visit: Payer: Managed Care, Other (non HMO) | Admitting: Family Medicine

## 2022-09-14 ENCOUNTER — Encounter: Payer: Self-pay | Admitting: Family Medicine

## 2022-09-14 VITALS — BP 126/67 | HR 68 | Ht 64.0 in | Wt 219.0 lb

## 2022-09-14 DIAGNOSIS — R7402 Elevation of levels of lactic acid dehydrogenase (LDH): Secondary | ICD-10-CM | POA: Diagnosis not present

## 2022-09-14 DIAGNOSIS — F418 Other specified anxiety disorders: Secondary | ICD-10-CM

## 2022-09-14 DIAGNOSIS — R7401 Elevation of levels of liver transaminase levels: Secondary | ICD-10-CM | POA: Diagnosis not present

## 2022-09-14 NOTE — Progress Notes (Signed)
.    Established Patient Office Visit  Subjective   Patient ID: Alyssa Bass, female    DOB: Jun 26, 1966  Age: 57 y.o. MRN: [id number]  Chief Complaint  Patient presents with   Follow-up    6 week follow up    HPI  F/U anxiety /depression - we inc sertraline and added wellbutrin about 5 weeks ago.  Referred to therapy/counseling.  She doing really well on the current regimen she was getting to the point where she felt the sertraline was not working as well.  She has been on it for years.  Plus when I saw her last time she was just going through a lot she was very worried about her prognosis with her new onset tremor and she already has relapsing remitting MS.  And was concerned about that.  She did consult with Dr. Rachel Moulds and felt like he did a fabulous job of really explaining things to her and evaluating her she felt like he was very thorough and really felt very comforted.  She was diagnosed with benign essential tremor.    ROS    Objective:     BP 126/67 (BP Location: Left Arm, Patient Position: Sitting, Cuff Size: Large)   Pulse 68   Ht '5\' 4"'$  (1.626 m)   Wt 219 lb (99.3 kg)   LMP 12/16/2013   SpO2 97%   BMI 37.59 kg/m    Physical Exam Vitals and nursing note reviewed.  Constitutional:      Appearance: She is well-developed.  HENT:     Head: Normocephalic and atraumatic.  Cardiovascular:     Rate and Rhythm: Normal rate and regular rhythm.     Heart sounds: Normal heart sounds.  Pulmonary:     Effort: Pulmonary effort is normal.     Breath sounds: Normal breath sounds.  Skin:    General: Skin is warm and dry.  Neurological:     Mental Status: She is alert and oriented to person, place, and time.  Psychiatric:        Behavior: Behavior normal.      No results found for any visits on 09/14/22.    The 10-year ASCVD risk score (Arnett DK, et al., 2019) is: 2.2%    Assessment & Plan:   Problem List Items Addressed This Visit       Other    TRANSAMINASES, SERUM, ELEVATED    She did have a mild bump up of one of her liver enzymes and had that repeated a few weeks ago and it was back down to normal.  Still unclear etiology for potential cause but we will certainly keep an eye on it with her Tecfidera.      Anxiety with depression - Primary    Will continue with current regimen for now.  PHQ-9 score of 3 and GAD-7 score of 2.  We discussed following up in about 3 to 4 months.  If she is still doing really well at that point I would be interested in trying to taper back off the Wellbutrin just to reduce her pill burden.  And then in the fall we could always consider going back down on the sertraline if she is still doing well.       Will call for her mammogram report from her OB/GYN.  Return in about 4 months (around 01/05/2023) for Follow up Mood .    Beatrice Lecher, MD

## 2022-09-14 NOTE — Assessment & Plan Note (Signed)
She did have a mild bump up of one of her liver enzymes and had that repeated a few weeks ago and it was back down to normal.  Still unclear etiology for potential cause but we will certainly keep an eye on it with her Tecfidera.

## 2022-09-14 NOTE — Assessment & Plan Note (Signed)
Will continue with current regimen for now.  PHQ-9 score of 3 and GAD-7 score of 2.  We discussed following up in about 3 to 4 months.  If she is still doing really well at that point I would be interested in trying to taper back off the Wellbutrin just to reduce her pill burden.  And then in the fall we could always consider going back down on the sertraline if she is still doing well.

## 2022-09-16 ENCOUNTER — Ambulatory Visit: Payer: Managed Care, Other (non HMO) | Admitting: Sports Medicine

## 2022-09-16 DIAGNOSIS — M19042 Primary osteoarthritis, left hand: Secondary | ICD-10-CM

## 2022-09-16 DIAGNOSIS — M19041 Primary osteoarthritis, right hand: Secondary | ICD-10-CM

## 2022-09-16 NOTE — Assessment & Plan Note (Signed)
Alyssa Bass returns, she is a pleasant 57 year old female, she has had a negative screen for rheumatoid arthritis, she had right third PIP pain, we injected it at the last visit she returns today pain-free. She is having increasing pain right thumb basal joint, adding some home conditioning, she will return to see me for injection if not better.

## 2022-09-16 NOTE — Progress Notes (Signed)
    Procedures performed today:    None.  Independent interpretation of notes and tests performed by another provider:   None.  Brief History, Exam, Impression, and Recommendations:    Primary osteoarthritis of both hands Alyssa Bass returns, she is a pleasant 57 year old female, she has had a negative screen for rheumatoid arthritis, she had right third PIP pain, we injected it at the last visit she returns today pain-free. She is having increasing pain right thumb basal joint, adding some home conditioning, she will return to see me for injection if not better.    ____________________________________________ Alyssa Bass. Alyssa Bass, M.D., ABFM., CAQSM., AME. Primary Care and Sports Medicine  [facility]  Adjunct Professor of [place] [employer] [facility] [employer]  Risk manager

## 2022-09-27 ENCOUNTER — Other Ambulatory Visit: Payer: Self-pay | Admitting: Sports Medicine

## 2022-09-27 DIAGNOSIS — M19041 Primary osteoarthritis, right hand: Secondary | ICD-10-CM

## 2022-10-10 ENCOUNTER — Other Ambulatory Visit: Payer: Self-pay | Admitting: Neurology

## 2022-10-10 MED ORDER — AMPHETAMINE-DEXTROAMPHET ER 30 MG PO CP24: 30.0000 mg | ORAL_CAPSULE | Freq: Every day | ORAL | 0 refills | Status: DC

## 2022-10-10 NOTE — Telephone Encounter (Signed)
Last seen on 08/24/22 Follow up scheduled on 03/01/23 Last filled on 09/02/22 # 30 tablets  Rx pending to signed

## 2022-10-10 NOTE — Telephone Encounter (Signed)
Pt called. Requesting a refill on amphetamine-dextroamphetamine (ADDERALL XR) 30 MG 24 hr capsule. [id number]

## 2022-10-26 ENCOUNTER — Other Ambulatory Visit: Payer: Self-pay | Admitting: *Deleted

## 2022-10-26 DIAGNOSIS — F418 Other specified anxiety disorders: Secondary | ICD-10-CM

## 2022-10-26 MED ORDER — SERTRALINE HCL 100 MG PO TABS: 200.0000 mg | ORAL_TABLET | Freq: Every day | ORAL | 1 refills | Status: DC

## 2022-11-03 ENCOUNTER — Telehealth: Payer: Self-pay | Admitting: Neurology

## 2022-11-03 ENCOUNTER — Other Ambulatory Visit: Payer: Self-pay

## 2022-11-03 MED ORDER — DIMETHYL FUMARATE 240 MG PO CPDR: 1.0000 | DELAYED_RELEASE_CAPSULE | Freq: Two times a day (BID) | ORAL | 3 refills | Status: DC

## 2022-11-03 NOTE — Telephone Encounter (Signed)
Last Seen 08/24/2022 Upcoming Appointment 03/01/2023  Dimethyl Last Dispensed 09/06/2022 30 day Supply Escript 11/03/2022

## 2022-11-03 NOTE — Telephone Encounter (Signed)
Pt is requesting a refill for Dimethyl Fumarate 240 MG CPDR.  Pharmacy: [facility]

## 2022-11-14 ENCOUNTER — Other Ambulatory Visit: Payer: Self-pay | Admitting: Neurology

## 2022-11-14 MED ORDER — AMPHETAMINE-DEXTROAMPHET ER 30 MG PO CP24: 30.0000 mg | ORAL_CAPSULE | Freq: Every day | ORAL | 0 refills | Status: DC

## 2022-11-14 NOTE — Telephone Encounter (Signed)
Last seen 08/24/22 and next f/u 03/01/23. Per drug registry, last refilled 10/10/22 #30.

## 2022-11-14 NOTE — Telephone Encounter (Signed)
Pt is requesting a refill for amphetamine-dextroamphetamine (ADDERALL XR) 30 MG 24 hr capsule.  [id number]   

## 2022-11-16 ENCOUNTER — Telehealth: Payer: Self-pay | Admitting: Family Medicine

## 2022-11-16 NOTE — Telephone Encounter (Signed)
Please send request to [facility] for her last mammogram and Pap smear.

## 2022-11-17 ENCOUNTER — Ambulatory Visit: Payer: Self-pay | Admitting: *Deleted

## 2022-11-17 NOTE — Progress Notes (Signed)
Requested pap and mammo to be sent

## 2022-11-17 NOTE — Telephone Encounter (Signed)
Sent med records request for mammo and pap to [facility] via mychart.

## 2022-12-12 ENCOUNTER — Other Ambulatory Visit: Payer: Self-pay | Admitting: Neurology

## 2022-12-12 MED ORDER — AMPHETAMINE-DEXTROAMPHET ER 30 MG PO CP24: 30.0000 mg | ORAL_CAPSULE | Freq: Every day | ORAL | 0 refills | Status: DC

## 2022-12-12 NOTE — Telephone Encounter (Signed)
Pt last seen 08/24/22 and next f/u 03/01/23. Last refilled adderall XR 30mg  11/14/22 #30.

## 2022-12-12 NOTE — Telephone Encounter (Signed)
Pt is needing a refill on her amphetamine-dextroamphetamine (ADDERALL XR) 30 MG 24 hr capsule [facility] [address] °

## 2023-01-16 ENCOUNTER — Other Ambulatory Visit: Payer: Self-pay | Admitting: Neurology

## 2023-01-16 MED ORDER — AMPHETAMINE-DEXTROAMPHET ER 30 MG PO CP24: 30.0000 mg | ORAL_CAPSULE | Freq: Every day | ORAL | 0 refills | Status: DC

## 2023-01-16 NOTE — Telephone Encounter (Signed)
Pt is requesting a refill for amphetamine-dextroamphetamine (ADDERALL XR) 30 MG 24 hr capsule.  [id number]   

## 2023-01-16 NOTE — Telephone Encounter (Signed)
Pt last seen on 09/03/22 Follow up scheduled on 03/01/23 Last filled on 12/12/22 #30 tablets (30 day supply) Rx pending to be signed

## 2023-01-25 ENCOUNTER — Other Ambulatory Visit: Payer: Self-pay | Admitting: Family Medicine

## 2023-01-25 DIAGNOSIS — I10 Essential (primary) hypertension: Secondary | ICD-10-CM

## 2023-02-07 ENCOUNTER — Ambulatory Visit: Payer: Managed Care, Other (non HMO) | Admitting: Family Medicine

## 2023-02-07 ENCOUNTER — Encounter: Payer: Self-pay | Admitting: Family Medicine

## 2023-02-07 VITALS — BP 132/59 | HR 64 | Ht 64.0 in | Wt 223.0 lb

## 2023-02-07 DIAGNOSIS — R7301 Impaired fasting glucose: Secondary | ICD-10-CM

## 2023-02-07 DIAGNOSIS — Z23 Encounter for immunization: Secondary | ICD-10-CM | POA: Diagnosis not present

## 2023-02-07 DIAGNOSIS — F418 Other specified anxiety disorders: Secondary | ICD-10-CM | POA: Diagnosis not present

## 2023-02-07 DIAGNOSIS — F330 Major depressive disorder, recurrent, mild: Secondary | ICD-10-CM

## 2023-02-07 DIAGNOSIS — I10 Essential (primary) hypertension: Secondary | ICD-10-CM

## 2023-02-07 LAB — POCT GLYCOSYLATED HEMOGLOBIN (HGB A1C): Hemoglobin A1C: 5.6 % (ref 4.0–5.6)

## 2023-02-07 MED ORDER — ARIPIPRAZOLE 2 MG PO TABS
2.0000 mg | ORAL_TABLET | Freq: Every day | ORAL | 2 refills | Status: DC
Start: 2023-02-07 — End: 2023-03-08

## 2023-02-07 NOTE — Progress Notes (Addendum)
Established Patient Office Visit  Subjective   Patient ID: Alyssa Bass, female    DOB: Feb 13, 1966  Age: 57 y.o. MRN: [id number]  Chief Complaint  Patient presents with   mood    HPI  F/U depression and anxiety - tried WEllbutrin x 2 weeks and not helping.  She discontinued it.  She says she is interested in maybe another type of add-on therapy she would like to keep the sertraline 20 mg daily.  She says she seems in commercials on TV and wondered if she might be a candidate for 1 of those medications.  She has not had any thoughts to harm herself but still is just struggling with low mood in general.  Sleep quality is good right now.  Supplements with delta 8 at night.  Impaired fasting glucose-no increased thirst or urination. No symptoms consistent with hypoglycemia.  She is interested in trying to lose weight.     ROS    Objective:     BP (!) 132/59   Pulse 64   Ht 5\' 4"  (1.626 m)   Wt 223 lb (101.2 kg)   LMP 12/16/2013   SpO2 97%   BMI 38.28 kg/m     Physical Exam Vitals and nursing note reviewed.  Constitutional:      Appearance: She is well-developed.  HENT:     Head: Normocephalic and atraumatic.  Cardiovascular:     Rate and Rhythm: Normal rate and regular rhythm.     Heart sounds: Normal heart sounds.  Pulmonary:     Effort: Pulmonary effort is normal.     Breath sounds: Normal breath sounds.  Skin:    General: Skin is warm and dry.  Neurological:     Mental Status: She is alert and oriented to person, place, and time.  Psychiatric:        Behavior: Behavior normal.     Results for orders placed or performed in visit on 02/07/23  POCT glycosylated hemoglobin (Hb A1C)  Result Value Ref Range   Hemoglobin A1C 5.6 4.0 - 5.6 %   HbA1c POC (<> result, manual entry)     HbA1c, POC (prediabetic range)     HbA1c, POC (controlled diabetic range)         The 10-year ASCVD risk score (Arnett DK, et al., 2019) is: 2.4%    Assessment & Plan:    Problem List Items Addressed This Visit       Cardiovascular and Mediastinum   HYPERTENSION, BENIGN    Pressure looks great.        Endocrine   IFG (impaired fasting glucose)    A1c looks great today.  Continue to follow.      Relevant Orders   POCT glycosylated hemoglobin (Hb A1C) (Completed)     Other   Anxiety with depression - Primary    Discussed options.  Will try adding a low-dose of Abilify 2.1 mg.  Currently she is sleeping well and not having any issues with that.      Other Visit Diagnoses     Encounter for immunization       Relevant Orders   Tdap vaccine greater than or equal to 7yo IM (Completed)   Mild episode of recurrent major depressive disorder (HCC)       Relevant Medications   ARIPiprazole (ABILIFY) 2 MG tablet      Weight management-we did discuss potential referral to healthy weight and wellness.  She says she just signed up for a program  through work that gives them a Patent examiner and some strategies.  So I think that is fantastic and encouraged her to try to use that for the next couple of months to see if that is helpful she may also consider using a calorie counter to help her track caloric intake.  Tdap given today.  Return in about 2 months (around 04/10/2023) for New start medication, MDD.   I spent 26 minutes on the day of the encounter to include pre-visit record review, face-to-face time with the patient and post visit ordering of test.   Nani Gasser, MD

## 2023-02-07 NOTE — Assessment & Plan Note (Signed)
A1c looks great today.  Continue to follow.

## 2023-02-07 NOTE — Assessment & Plan Note (Signed)
Discussed options.  Will try adding a low-dose of Abilify 2.1 mg.  Currently she is sleeping well and not having any issues with that.

## 2023-02-07 NOTE — Assessment & Plan Note (Signed)
Pressure looks great.

## 2023-02-15 ENCOUNTER — Other Ambulatory Visit: Payer: Self-pay | Admitting: Neurology

## 2023-02-15 MED ORDER — AMPHETAMINE-DEXTROAMPHET ER 30 MG PO CP24: 30.0000 mg | ORAL_CAPSULE | Freq: Every day | ORAL | 0 refills | Status: DC

## 2023-02-15 NOTE — Telephone Encounter (Signed)
Pt is needing a refill on her amphetamine-dextroamphetamine (ADDERALL XR) 30 MG 24 hr capsule and is [facility] [address]

## 2023-02-15 NOTE — Telephone Encounter (Signed)
Requested Prescriptions   Pending Prescriptions Disp Refills   amphetamine-dextroamphetamine (ADDERALL XR) 30 MG 24 hr capsule 30 capsule 0    Sig: Take 1 capsule (30 mg total) by mouth daily.   Last seen 08/24/22, next appt 03/01/23  Dispenses   Dispensed Days Supply Quantity Provider Pharmacy  DEXTROAMP-AMPHET ER 30 MG CAP 01/16/2023 30 30 each Sater, Pearletha Furl, MD [facility] [phone] - G...  DEXTROAMP-AMPHET ER 30 MG CAP 12/12/2022 30 30 each Sater, Pearletha Furl, MD [facility] [phone] - G...  DEXTROAMP-AMPHET ER 30 MG CAP 11/14/2022 30 30 each Sater, Pearletha Furl, MD [facility] [phone] - G...  DEXTROAMP-AMPHET ER 30 MG CAP 10/10/2022 30 30 each Sater, Pearletha Furl, MD [facility] [phone] - G...  DEXTROAMP-AMPHET ER 30 MG CAP 09/02/2022 30 30 each Micki Riley, MD [facility] [phone] - G...  DEXTROAMP-AMPHET ER 30 MG CAP 08/02/2022 30 30 each Sater, Pearletha Furl, MD [facility] [phone] - G...  DEXTROAMP-AMPHET ER 30 MG CAP 06/29/2022 30 30 each Huston Foley, MD [facility] [phone] - G...  DEXTROAMP-AMPHET ER 30 MG CAP 05/19/2022 30 30 each Huston Foley, MD [facility] [phone] - G...  DEXTROAMP-AMPHET ER 30 MG CAP 04/18/2022 30 30 each Dohmeier, Porfirio Mylar, MD [facility] [phone] - G...  DEXTROAMP-AMPHET ER 30 MG CAP 03/01/2022 30 30 each Dohmeier, Porfirio Mylar, MD [facility] [phone] - G.[name] KitchenMarland Kitchen

## 2023-02-20 ENCOUNTER — Ambulatory Visit: Payer: Managed Care, Other (non HMO) | Admitting: Sports Medicine

## 2023-02-20 DIAGNOSIS — M19041 Primary osteoarthritis, right hand: Secondary | ICD-10-CM | POA: Diagnosis not present

## 2023-02-20 DIAGNOSIS — M19042 Primary osteoarthritis, left hand: Secondary | ICD-10-CM

## 2023-02-20 MED ORDER — PREDNISONE 50 MG PO TABS
ORAL_TABLET | ORAL | 0 refills | Status: AC
Start: 2023-02-20 — End: ?

## 2023-02-20 MED ORDER — TRAMADOL HCL 50 MG PO TABS
50.0000 mg | ORAL_TABLET | Freq: Three times a day (TID) | ORAL | 0 refills | Status: AC | PRN
Start: 2023-02-20 — End: ?

## 2023-02-20 NOTE — Assessment & Plan Note (Signed)
Alyssa Bass returns, she is a pleasant 57 year old female, known bilateral hand osteoarthritis with a negative screen for rheumatoid arthritis, she had a right third PIP arthralgia with synovitis, injected back in February, she did well, now having recurrence of pain. Adding a 5-day course of prednisone, tramadol, Celebrex ineffective so we will discontinue this. She will ice 20 minutes 3-4 times a day and return to see me sometime next week if not feeling significantly better.

## 2023-02-20 NOTE — Progress Notes (Signed)
    Procedures performed today:    None.  Independent interpretation of notes and tests performed by another provider:   None.  Brief History, Exam, Impression, and Recommendations:    Primary osteoarthritis of both hands Alyssa Bass returns, she is a pleasant 57 year old female, known bilateral hand osteoarthritis with a negative screen for rheumatoid arthritis, she had a right third PIP arthralgia with synovitis, injected back in February, she did well, now having recurrence of pain. Adding a 5-day course of prednisone, tramadol, Celebrex ineffective so we will discontinue this. She will ice 20 minutes 3-4 times a day and return to see me sometime next week if not feeling significantly better.    ____________________________________________ Ihor Austin. Benjamin Stain, M.D., ABFM., CAQSM., AME. Primary Care and Sports Medicine [facility]  Adjunct Professor of Family Medicine  [place] [employer] [facility] [employer]  Restaurant manager, fast food

## 2023-02-28 ENCOUNTER — Telehealth: Payer: Self-pay | Admitting: *Deleted

## 2023-02-28 NOTE — Telephone Encounter (Signed)
I called patient and left detailed message on voicemail to bring cpap machine long with power cord to visit on 03/01/23.

## 2023-03-01 ENCOUNTER — Encounter: Payer: Self-pay | Admitting: Neurology

## 2023-03-01 ENCOUNTER — Telehealth: Payer: Self-pay | Admitting: Neurology

## 2023-03-01 ENCOUNTER — Ambulatory Visit: Payer: Managed Care, Other (non HMO) | Admitting: Neurology

## 2023-03-01 VITALS — BP 138/85 | HR 72 | Ht 63.0 in | Wt 224.5 lb

## 2023-03-01 DIAGNOSIS — Z79899 Other long term (current) drug therapy: Secondary | ICD-10-CM | POA: Diagnosis not present

## 2023-03-01 DIAGNOSIS — E348 Other specified endocrine disorders: Secondary | ICD-10-CM

## 2023-03-01 DIAGNOSIS — M129 Arthropathy, unspecified: Secondary | ICD-10-CM

## 2023-03-01 DIAGNOSIS — G35 Multiple sclerosis: Secondary | ICD-10-CM | POA: Diagnosis not present

## 2023-03-01 MED ORDER — PROPRANOLOL HCL ER 80 MG PO CP24: 80.0000 mg | ORAL_CAPSULE | Freq: Every day | ORAL | 3 refills | Status: DC

## 2023-03-01 NOTE — Progress Notes (Signed)
[facility]  PATIENT: Alyssa Bass DOB: September 23, 1965  REFERRING DOCTOR OR PCP: Nani Gasser MD SOURCE: Patient, notes from neurology, imaging and lab reports, MRI images personally reviewed.  _________________________________   HISTORICAL  CHIEF COMPLAINT:  Chief Complaint  Patient presents with   Room 10    Pt is here Alone. Pt states that things have been okay since her last appointment. Pt states that her tremors in her head have gotten a little bit better but she still has them. Pt states that her finger tips of right hand.     HISTORY OF PRESENT ILLNESS:  Alyssa Bass is a 57 y.o. woman with multiple sclerosis, sleep apnea third ventricle colloid cyst  Update 03/01/2023 She is using CPAP nightly with 98% compliance and outstanding efficacy with AHI = 0.5  She feels the multiple sclerosis is stable.  She has been on Tecfidera for several years and tolerates it well.  She denies any recent exacerbation.  However, she has noted a tremor in her head since mid 2023.     I noticed a tremor in her hands though she had not noted (better in right dominant, worse in left).    She notes the head tremor most while using the computer.   She has no family history of tremor.  She is not having any change in her gait.  She notes the tremor most in her head when she is using her computer and in the hand when she is putting on make-up.    Currently, gait and balance are doing well.  She does not need to use the bannister during stairs.  She has not had any falls.  She can walk several miles without a break.  She denies any significant numbness or weakness currently.  Patient has done well though she had optic neuritis initially.  She denies any difficulty with her bladder.  She notes some fatigue.  She has insomnia and takes delta-8 at night.   She notes no change in cognition recently.  Adderall has helped attention and fatigue.  She is on Zoloft and Wellbutrin for  mood.  She has more arthritic pain in her hands and has swelling in the PIP joint of middle finger on the right Besides the MS, she also has a third ventricle colloid cyst that has been noted on imaging studies.  It looks like it may be very minimally bigger in 2023 versus 2020 by a millimeter though by report and measurements back in 2012 and 2013 it is about the same size then as now.  We do not have imaging before 2022 personally reviewed  She also has OSA and uses CPAP regularly.  Download today showed excellent compliance (97%) and excellent efficacy (0.9/h AHI)   MS History: In 55 (age 57) she had optic neuritis on the left and she received IV solumedrol.   She had a couple spots in the brain and was told she might have MS.   A few years later, she had an LP that showed OCB.    She had an exacerbation with  numbness in arms and legs.  Symptoms improved with steroids   She was placed on Avonex and then Rebif.    She switched to Tecfidera around 2012.     HSe has not had any exacerbation  IMAGING: MRI of the brain 08/19/2021 shows scattered T2/FLAIR hyperintense foci in the periventricular, juxtacortical and deep white matter.  None of the foci enhance or appear to be acute.  No changes compared to 08/11/2018.  There is a third ventricle colloid cyst (hypointense on T2 weighted images and hyperintense on T1-weighted images) measuring 12 x 13 x 11 mm (AP x transverse x craniocaudal).  It is the same size or no more than 1 mm larger compared to the 2020 MRI (but by report the same size as in 2013)  MRI of the cervical spine 08/19/2021 shows 2 T2 hyperintense foci consistent with MS centrally adjacent to C2 and posterolaterally to the right adjacent to C2-C3.  There is a disc protrusion at C5-C6 indenting the thecal sac without causing spinal cord compression.  Neuroforamina are widely patent there is no n   OSA History She was with OSA many years ago and is on her second CPAP machine for the past few  years.  Download last year showed AHI=0.2 and 96% compliance.   HST 04/05/2022 showed severe OSA with AHI=36.7 (severe in REM at 60/h).   She was recmmended to be on APAP 5-16  REVIEW OF SYSTEMS: Constitutional: No fevers, chills, sweats, or change in appetite Eyes: No visual changes, double vision, eye pain Ear, nose and throat: No hearing loss, ear pain, nasal congestion, sore throat Cardiovascular: No chest pain, palpitations Respiratory:  No shortness of breath at rest or with exertion.   No wheezes GastrointestinaI: No nausea, vomiting, diarrhea, abdominal pain, fecal incontinence Genitourinary:  No dysuria, urinary retention or frequency.  No nocturia. Musculoskeletal:  No neck pain, back pain Integumentary: No rash, pruritus, skin lesions Neurological: as above Psychiatric: No depression at this time.  No anxiety Endocrine: No palpitations, diaphoresis, change in appetite, change in weigh or increased thirst Hematologic/Lymphatic:  No anemia, purpura, petechiae. Allergic/Immunologic: No itchy/runny eyes, nasal congestion, recent allergic reactions, rashes  ALLERGIES: No Known Allergies  HOME MEDICATIONS:  Current Outpatient Medications:    amphetamine-dextroamphetamine (ADDERALL XR) 30 MG 24 hr capsule, Take 1 capsule (30 mg total) by mouth daily., Disp: 30 capsule, Rfl: 0   Dimethyl Fumarate 240 MG CPDR, Take 1 capsule (240 mg total) by mouth 2 (two) times daily., Disp: 180 capsule, Rfl: 3   lisinopril-hydrochlorothiazide (ZESTORETIC) 20-12.5 MG tablet, TAKE 1 TABLET DAILY, Disp: 90 tablet, Rfl: 3   sertraline (ZOLOFT) 100 MG tablet, Take 2 tablets (200 mg total) by mouth daily., Disp: 180 tablet, Rfl: 1   traMADol (ULTRAM) 50 MG tablet, Take 1 tablet (50 mg total) by mouth every 8 (eight) hours as needed for moderate pain., Disp: 21 tablet, Rfl: 0   tretinoin (RETIN-A) 0.025 % cream, Apply topically., Disp: , Rfl:    ARIPiprazole (ABILIFY) 2 MG tablet, Take 1 tablet (2 mg total)  by mouth daily. (Patient not taking: Reported on 03/01/2023), Disp: 30 tablet, Rfl: 2   predniSONE (DELTASONE) 50 MG tablet, One tab PO daily for 5 days. 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   Depression    Hypertension    MS (multiple sclerosis) (HCC)    Tachycardia     PAST SURGICAL HISTORY: Past Surgical History:  Procedure Laterality Date   ACHILLES TENDON SURGERY     Hagland deformity   COLONOSCOPY W/ POLYPECTOMY     COLONOSCOPY WITH PROPOFOL N/A 05/01/2015   Procedure:  COLONOSCOPY WITH PROPOFOL;  Surgeon: Carman Ching, MD;  Location: [facility];  Service: Endoscopy;  Laterality: N/A;   Left ankle surgery  2004    FAMILY HISTORY: Family History  Problem Relation Age of Onset   Hypertension Mother    Sleep apnea Mother    Cancer Father        pre cancerous colon   Cancer Other        colon   Heart attack Other        grandmother   Hypertension Brother    Sleep apnea Brother    Sleep apnea Sister     SOCIAL HISTORY: Social History   Socioeconomic History   Marital status: Single    Spouse name: Loraine Leriche   Number of children: 2   Years of education: 14   Highest education level: Not on file  Occupational History    Employer: [employer]    Comment: Psychologist, forensic  Tobacco Use   Smoking status: Former    Current packs/day: 0.00    Types: Cigarettes    Quit date: 08/11/2015    Years since quitting: 7.5   Smokeless tobacco: Never  Substance and Sexual Activity   Alcohol use: Yes    Alcohol/week: 0.0 standard drinks of alcohol    Comment: occas.   Drug use: No   Sexual activity: Not on file  Other Topics Concern   Not on file  Social History Narrative   Patient is married Government social research officer) and  lives at home with her husband and children.   Patient has two children.   Patient is working full-time.   Patient has a college degree.   Patient is right-handed.   Patient drinks 2 cups of coffee daily.      Social Determinants of Health   Financial Resource Strain: Not on file  Food Insecurity: Not on file  Transportation Needs: Not on file  Physical Activity: Not on file  Stress: Not on file  Social Connections: Not on file  Intimate Partner Violence: Not on file       PHYSICAL EXAM  Vitals:   03/01/23 0818  BP: 138/85  Pulse: 72  Weight: 224 lb 8 oz (101.8 kg)  Height: 5\' 3"  (1.6 m)    Body mass index is 39.77 kg/m.   General: The patient is well-developed and well-nourished and in no acute distress  HEENT:  Head is Briggs/AT.  Sclera are anicteric.    Neck:  The neck has good ROM.   Skin: Extremities are without rash or  edema.  Musculoskeletal:  Back is nontender  Neurologic Exam  Mental status: The patient is alert and oriented x 3 at the time of the examination. The patient has apparent normal recent and remote memory, with an apparently normal attention span and concentration ability.   Speech is normal.  Cranial nerves: Extraocular movements are full.   Facial symmetry is present. There is good facial sensation to soft touch bilaterally.Facial strength is normal.  Trapezius and sternocleidomastoid strength is normal. No dysarthria is noted.   No obvious hearing deficits are noted.  Motor:  Muscle bulk is normal.   Tone is normal. Strength is  5 / 5 in all 4 extremities.   Other: She has a 7 Hz eccentric tremor in the head and in the hands, left greater than right  Sensory: Sensory testing is intact to pinprick, soft touch and vibration sensation in all 4 extremities.  Coordination: Cerebellar testing reveals good finger-nose-finger and heel-to-shin  bilaterally.  Gait and station: Station is normal.   Gait is normal. Tandem gait is mildly wide.  Romberg is negative.   Reflexes: Deep tendon reflexes are symmetric and normal bilaterally.   Plantar responses are flexor.    DIAGNOSTIC DATA (LABS, IMAGING, TESTING) - I reviewed patient records, labs, notes, testing and imaging myself where available.  Lab Results  Component Value Date   WBC 5.9 08/24/2022   HGB 13.8 08/24/2022   HCT 40.5 08/24/2022   MCV 92 08/24/2022   PLT 266 08/24/2022      Component Value Date/Time   NA 140 08/02/2022 0858   NA 139 07/31/2018 0831   K 4.2 08/02/2022 0858   CL 105 08/02/2022 0858   CO2 24 08/02/2022 0858   GLUCOSE 102 (H) 08/02/2022 0858   BUN 21 08/02/2022 0858   BUN 13 07/31/2018 0831   CREATININE 0.71 08/02/2022 0858   CALCIUM 9.6 08/02/2022 0858   PROT 7.3 08/24/2022 1234   ALBUMIN 4.8 08/24/2022 1234   AST 19 08/24/2022 1234   ALT 17 08/24/2022 1234   ALKPHOS 72 08/24/2022 1234   BILITOT 0.4 08/24/2022 1234   GFRNONAA 95 07/31/2018 0831   GFRNONAA 107 04/16/2018 0756   GFRAA 109 07/31/2018 0831   GFRAA 125 04/16/2018 0756   Lab Results  Component Value Date   CHOL 199 08/02/2022   HDL 59 08/02/2022   LDLCALC 120 (H) 08/02/2022   LDLDIRECT 112 (H) 07/26/2021   TRIG 101 08/02/2022   CHOLHDL 3.4 08/02/2022   Lab Results  Component Value Date   HGBA1C 5.6 02/07/2023   No results found for: "VITAMINB12" Lab Results  Component Value Date   TSH 1.18 08/02/2022       ASSESSMENT AND PLAN  Relapsing remitting multiple sclerosis (HCC) - Plan: CBC with Differential/Platelet, Comprehensive metabolic panel, MR BRAIN W WO CONTRAST  High risk medication use - Plan: CBC with Differential/Platelet, Comprehensive metabolic panel  Arthritis, multiple joint involvement - Plan: Rheumatoid factor, CYCLIC CITRUL PEPTIDE ANTIBODY, IGG/IGA, Uric Acid  Pineal gland cyst - Plan: MR BRAIN W WO CONTRAST   Continue dimethyl fumarate for MS.  Check labs. Continue propranolol LA increase based on response Stay active and exercise  as tolerated. We will check an MRI to assess the colloid cyst and look for subclinical progression of  MS. Continue CPAP.   Has arthritic pain and PIP selling in a couple  fingers.  We will check RA and CCP-Ab.   If does have RA, consider changing DMF to teriflunomide or Leflunomide to treat both RA and MS Return in 6 months or sooner if there are new or worsening neurologic symptoms.    Kamin Niblack A. Epimenio Foot, MD, [facility] 03/01/2023, 9:03 AM Certified in Neurology, Clinical Neurophysiology, Sleep Medicine and Neuroimaging  [facility] [address], [address] [place], [place] [address] [phone]

## 2023-03-01 NOTE — Telephone Encounter (Signed)
Sent to GI they obtain Holli Humbles [phone]

## 2023-03-02 ENCOUNTER — Other Ambulatory Visit: Payer: Self-pay | Admitting: Family Medicine

## 2023-03-02 DIAGNOSIS — F330 Major depressive disorder, recurrent, mild: Secondary | ICD-10-CM

## 2023-03-03 LAB — CBC WITH DIFFERENTIAL/PLATELET
Basophils Absolute: 0 10*3/uL (ref 0.0–0.2)
Basos: 1 %
EOS (ABSOLUTE): 0.2 10*3/uL (ref 0.0–0.4)
Eos: 4 %
Hematocrit: 39.3 % (ref 34.0–46.6)
Hemoglobin: 13.3 g/dL (ref 11.1–15.9)
Immature Grans (Abs): 0 10*3/uL (ref 0.0–0.1)
Immature Granulocytes: 0 %
Lymphocytes Absolute: 1.2 10*3/uL (ref 0.7–3.1)
Lymphs: 25 %
MCH: 32 pg (ref 26.6–33.0)
MCHC: 33.8 g/dL (ref 31.5–35.7)
MCV: 95 fL (ref 79–97)
Monocytes Absolute: 0.6 10*3/uL (ref 0.1–0.9)
Monocytes: 11 %
Neutrophils Absolute: 2.9 10*3/uL (ref 1.4–7.0)
Neutrophils: 59 %
Platelets: 249 10*3/uL (ref 150–450)
RBC: 4.15 x10E6/uL (ref 3.77–5.28)
RDW: 12.4 % (ref 11.7–15.4)
WBC: 5 10*3/uL (ref 3.4–10.8)

## 2023-03-03 LAB — RHEUMATOID FACTOR: Rheumatoid fact SerPl-aCnc: 11.9 [IU]/mL (ref <14.0)

## 2023-03-03 LAB — COMPREHENSIVE METABOLIC PANEL
ALT: 14 IU/L (ref 0–32)
AST: 11 IU/L (ref 0–40)
Albumin: 4.3 g/dL (ref 3.8–4.9)
Alkaline Phosphatase: 71 IU/L (ref 44–121)
BUN/Creatinine Ratio: 29 — ABNORMAL HIGH (ref 9–23)
BUN: 20 mg/dL (ref 6–24)
Bilirubin Total: 0.7 mg/dL (ref 0.0–1.2)
CO2: 26 mmol/L (ref 20–29)
Calcium: 9.5 mg/dL (ref 8.7–10.2)
Chloride: 101 mmol/L (ref 96–106)
Creatinine, Ser: 0.68 mg/dL (ref 0.57–1.00)
Globulin, Total: 2.7 g/dL (ref 1.5–4.5)
Glucose: 115 mg/dL — ABNORMAL HIGH (ref 70–99)
Potassium: 4.6 mmol/L (ref 3.5–5.2)
Sodium: 141 mmol/L (ref 134–144)
Total Protein: 7 g/dL (ref 6.0–8.5)
eGFR: 103 mL/min/{1.73_m2} (ref >59)

## 2023-03-03 LAB — URIC ACID: Uric Acid: 4.8 mg/dL (ref 3.0–7.2)

## 2023-03-03 LAB — CYCLIC CITRUL PEPTIDE ANTIBODY, IGG/IGA: Cyclic Citrullin Peptide Ab: 3 U (ref 0–19)

## 2023-03-27 ENCOUNTER — Other Ambulatory Visit: Payer: Self-pay | Admitting: Neurology

## 2023-03-27 MED ORDER — AMPHETAMINE-DEXTROAMPHET ER 30 MG PO CP24: 30.0000 mg | ORAL_CAPSULE | Freq: Every day | ORAL | 0 refills | Status: DC

## 2023-03-27 NOTE — Telephone Encounter (Signed)
Pt last seen 03/01/2023 Upcoming Appointment  09/06/2023  Adderall Last Filled 02/09/2023 Escript 03/27/2023

## 2023-03-27 NOTE — Telephone Encounter (Signed)
Pt refill for amphetamine-dextroamphetamine (ADDERALL XR) 30 MG 24 hr capsule [id number]

## 2023-04-06 ENCOUNTER — Other Ambulatory Visit: Payer: Self-pay | Admitting: Family Medicine

## 2023-04-06 DIAGNOSIS — F418 Other specified anxiety disorders: Secondary | ICD-10-CM

## 2023-04-12 ENCOUNTER — Ambulatory Visit: Payer: Managed Care, Other (non HMO) | Admitting: Family Medicine

## 2023-04-12 NOTE — Progress Notes (Deleted)
   Established Patient Office Visit  Subjective   Patient ID: Alyssa Bass, female    DOB: [birth year]  Age: 57 y.o. MRN: [id number]  No chief complaint on file.   HPI  F/U anxiety with depression - New start abilify .    {History (Optional):23778}  ROS    Objective:     LMP 12/16/2013  {Vitals History (Optional):23777}  Physical Exam Vitals and nursing note reviewed.  Constitutional:      Appearance: Normal appearance.  HENT:     Head: Normocephalic and atraumatic.  Eyes:     Conjunctiva/sclera: Conjunctivae normal.  Cardiovascular:     Rate and Rhythm: Normal rate and regular rhythm.  Pulmonary:     Effort: Pulmonary effort is normal.     Breath sounds: Normal breath sounds.  Skin:    General: Skin is warm and dry.  Neurological:     Mental Status: She is alert.  Psychiatric:        Mood and Affect: Mood normal.    No results found for any visits on 04/12/23.  {Labs (Optional):23779}  The 10-year ASCVD risk score (Arnett DK, et al., 2019) is: 2.9%    Assessment & Plan:   Problem List Items Addressed This Visit       Other   Anxiety with depression - Primary    No follow-ups on file.    Nani Gasser, MD

## 2023-05-01 ENCOUNTER — Other Ambulatory Visit: Payer: Self-pay | Admitting: Neurology

## 2023-05-01 MED ORDER — AMPHETAMINE-DEXTROAMPHET ER 30 MG PO CP24: 30.0000 mg | ORAL_CAPSULE | Freq: Every day | ORAL | 0 refills | Status: DC

## 2023-05-01 NOTE — Telephone Encounter (Signed)
Last seen on 03/01/23 Follow up scheduled on 09/06/23 Last filled on 03/28/23 #30 tablets  Rx pending to be signed

## 2023-05-01 NOTE — Telephone Encounter (Signed)
Pt request refill for  amphetamine-dextroamphetamine (ADDERALL XR) 30 MG 24 hr capsule. Have moved to [facility] need [facility] permanently [facility] [address] [place], [place] [address] Phone: [phone]

## 2023-05-15 ENCOUNTER — Telehealth: Payer: Self-pay | Admitting: Neurology

## 2023-05-15 NOTE — Telephone Encounter (Signed)
LVM and sent mychart msg informing pt of need to reschedule 09/06/23 appt - MD out

## 2023-06-01 ENCOUNTER — Other Ambulatory Visit: Payer: Self-pay | Admitting: Neurology

## 2023-06-01 MED ORDER — AMPHETAMINE-DEXTROAMPHET ER 30 MG PO CP24: 30.0000 mg | ORAL_CAPSULE | Freq: Every day | ORAL | 0 refills | Status: DC

## 2023-06-01 NOTE — Telephone Encounter (Signed)
Last seen on 03/01/23 Follow up scheduled on 09/19/23 Last filled on 05/01/23 #30 tablets (30 day supply) Rx pending to be signed

## 2023-06-01 NOTE — Telephone Encounter (Signed)
Pt request refill for amphetamine-dextroamphetamine (ADDERALL XR) 30 MG 24 hr capsule [phone]

## 2023-07-06 ENCOUNTER — Other Ambulatory Visit (HOSPITAL_COMMUNITY): Payer: Self-pay

## 2023-07-06 ENCOUNTER — Other Ambulatory Visit: Payer: Self-pay | Admitting: Neurology

## 2023-07-06 ENCOUNTER — Telehealth: Payer: Self-pay

## 2023-07-06 MED ORDER — AMPHETAMINE-DEXTROAMPHET ER 30 MG PO CP24: 30.0000 mg | ORAL_CAPSULE | Freq: Every day | ORAL | 0 refills | Status: DC

## 2023-07-06 NOTE — Telephone Encounter (Signed)
I have routed this request to Dr. Frances Furbish to send in Dr Bonnita Hollow absence for review. The Alyssa Bass is due for the medication and [place] registry was verified.

## 2023-07-06 NOTE — Telephone Encounter (Signed)
Pt called and is needing a refill on her amphetamine-dextroamphetamine (ADDERALL XR) 30 MG 24 hr capsule  and is [facility] Pt did not realize she was running out and is wanting to know if the on call can fill for her. Please advise.

## 2023-07-06 NOTE — Telephone Encounter (Signed)
Pharmacy Patient Advocate Encounter   Received notification from CoverMyMeds that prior authorization for Dimethyl Fumarate 240MG  dr capsules is required/requested.   Insurance verification completed.   The patient is insured through [employer] .   Per test claim: PA required; PA submitted to above mentioned insurance via CoverMyMeds Key/confirmation #/EOC [id number] Status is pending

## 2023-07-17 NOTE — Telephone Encounter (Signed)
 Pt states [facility]. [facility]ing [facility] [phone]

## 2023-07-21 ENCOUNTER — Other Ambulatory Visit (HOSPITAL_COMMUNITY): Payer: Self-pay

## 2023-07-21 NOTE — Telephone Encounter (Signed)
 Pharmacy Patient Advocate Encounter  Received notification from [facility] that Prior Authorization for Dimethyl Fumarate 240MG  dr capsules has been APPROVED from 06/06/2023 to 07/07/2024   PA #/Case ID/Reference #: PA Case ID #: [id number]

## 2023-07-24 MED ORDER — AMPHETAMINE-DEXTROAMPHET ER 30 MG PO CP24: 30.0000 mg | ORAL_CAPSULE | Freq: Every day | ORAL | 0 refills | Status: DC

## 2023-07-24 NOTE — Telephone Encounter (Signed)
 Pt states refill was never sent to the correct pharmacy. [phone]

## 2023-07-24 NOTE — Addendum Note (Signed)
 Addended by: Arther Abbott on: 07/24/2023 09:04 AM   Modules accepted: Orders

## 2023-07-27 ENCOUNTER — Telehealth: Payer: Self-pay | Admitting: Neurology

## 2023-07-27 NOTE — Telephone Encounter (Addendum)
Pt states the amphetamine-dextroamphetamine (ADDERALL XR) 30 MG 24 hr capsule was called into a [facility] in [place].  Pt now lives in [place] and the only [facility] her medication needs to be sent to [facility]  [id number] [place], [place]   Pt now being told that medication can not be filled because it has been filled already, pt states it has not been. Pt asked it be noted that she has been almost a week without this medication.

## 2023-07-27 NOTE — Telephone Encounter (Signed)
Called the [place] pharmacist. He states he sees where the previous was cancelled out and will reprocess on their end. It went through and he states that they will get this filled for her.   Called the pt and there was no answer. Left a message advising that they are stating she will be able to pick the medication up this evening.

## 2023-08-16 ENCOUNTER — Telehealth: Payer: Self-pay | Admitting: Neurology

## 2023-08-16 MED ORDER — PROPRANOLOL HCL ER 80 MG PO CP24: 80.0000 mg | ORAL_CAPSULE | Freq: Every day | ORAL | 1 refills | Status: DC

## 2023-08-16 NOTE — Telephone Encounter (Signed)
 Called and spoke w/ pt. Relayed we [facility] as [facility]. She verbalized understanding.

## 2023-08-16 NOTE — Telephone Encounter (Signed)
 Pt called stating that she is needing an Rx for her propranolol  ER (INDERAL  LA) 80 MG 24 hr capsule sent to [facility]. Please advise.

## 2023-08-31 ENCOUNTER — Other Ambulatory Visit: Payer: Self-pay | Admitting: Neurology

## 2023-08-31 MED ORDER — AMPHETAMINE-DEXTROAMPHET ER 30 MG PO CP24: 30.0000 mg | ORAL_CAPSULE | Freq: Every day | ORAL | 0 refills | Status: DC

## 2023-08-31 NOTE — Telephone Encounter (Signed)
Last seen 02/29/24 Next appt 09/19/23  Dispenses    Dispensed Days Supply Quantity Provider Pharmacy  DEXTROAMP-AMPHET ER 30 MG CAP 07/27/2023 30 30 each Sater, Pearletha Furl, MD [facility] [phone] - R...  DEXTROAMP-AMPHET ER 30 MG CAP 06/01/2023 30 30 each Sater, Pearletha Furl, MD [facility] [phone] - R...  DEXTROAMP-AMPHET ER 30 MG CAP 05/01/2023 30 30 each Sater, Pearletha Furl, MD [facility] [phone] - R...  DEXTROAMP-AMPHET ER 30 MG CAP 03/28/2023 30 30 each Dohmeier, Porfirio Mylar, MD [facility] [phone] - G...  DEXTROAMP-AMPHET ER 30 MG CAP 02/15/2023 30 30 each Sater, Pearletha Furl, MD [facility] [phone] - G...  DEXTROAMP-AMPHET ER 30 MG CAP 01/16/2023 30 30 each Sater, Pearletha Furl, MD [facility] [phone] - G...  DEXTROAMP-AMPHET ER 30 MG CAP 12/12/2022 30 30 each Sater, Pearletha Furl, MD [facility] [phone] - G...  DEXTROAMP-AMPHET ER 30 MG CAP 11/14/2022 30 30 each Sater, Pearletha Furl, MD [facility] [phone] - G...  DEXTROAMP-AMPHET ER 30 MG CAP 10/10/2022 30 30 each Sater, Pearletha Furl, MD [facility] [phone] - G.[name] KitchenMarland Kitchen

## 2023-08-31 NOTE — Telephone Encounter (Signed)
Pt is needing a refill on her amphetamine-dextroamphetamine (ADDERALL XR) 30 MG 24 hr capsule and needing it sent to the [facility] on [address]

## 2023-09-06 ENCOUNTER — Ambulatory Visit: Payer: Managed Care, Other (non HMO) | Admitting: Neurology

## 2023-09-14 NOTE — Telephone Encounter (Signed)
 The patient called and said she moved to [facility] and she would like to get her MRI done there. She rescheduled her office visit with Dr. Epimenio Foot since she didn't get the MRI done yet. I told her I will find a place in [place] and get it approved by insurance then they will call her to get scheduled.

## 2023-09-19 ENCOUNTER — Ambulatory Visit: Payer: Managed Care, Other (non HMO) | Admitting: Neurology

## 2023-10-03 ENCOUNTER — Other Ambulatory Visit: Payer: Self-pay | Admitting: Neurology

## 2023-10-03 MED ORDER — AMPHETAMINE-DEXTROAMPHET ER 30 MG PO CP24: 30.0000 mg | ORAL_CAPSULE | Freq: Every day | ORAL | 0 refills | Status: DC

## 2023-10-03 NOTE — Telephone Encounter (Signed)
 Pt is needing their amphetamine-dextroamphetamine (ADDERALL XR) 30 MG 24 hr capsule sent in to the [facility] in [facility]

## 2023-10-03 NOTE — Telephone Encounter (Signed)
 Last seen on 03/01/23 Follow up scheduled on 04/17/24 Last filled on 08/31/23 #30 tablets Rx pending to be signed

## 2023-10-23 ENCOUNTER — Other Ambulatory Visit: Payer: Self-pay | Admitting: Neurology

## 2023-11-08 ENCOUNTER — Other Ambulatory Visit: Payer: Self-pay | Admitting: Neurology

## 2023-11-08 MED ORDER — AMPHETAMINE-DEXTROAMPHET ER 30 MG PO CP24: 30.0000 mg | ORAL_CAPSULE | Freq: Every day | ORAL | 0 refills | Status: DC

## 2023-11-08 NOTE — Telephone Encounter (Signed)
 Last seen 03/01/23 and next f/u 04/17/24. Last refilled 10/03/23 #30.  [facility] cx appt 09/06/23 d/t MD being out and pt cx 09/19/23 appt.

## 2023-11-08 NOTE — Telephone Encounter (Signed)
 Patient request refill for amphetamine -dextroamphetamine  (ADDERALL XR) 30 MG 24 hr capsule [facility] [phone]

## 2023-11-20 NOTE — Telephone Encounter (Signed)
 She sees a [facility] neurologist now, last note 11/03/23 and they will be getting her MRIs per the note.

## 2023-12-11 ENCOUNTER — Other Ambulatory Visit: Payer: Self-pay | Admitting: Neurology

## 2023-12-11 MED ORDER — AMPHETAMINE-DEXTROAMPHET ER 30 MG PO CP24: 30.0000 mg | ORAL_CAPSULE | Freq: Every day | ORAL | 0 refills | Status: DC

## 2023-12-11 NOTE — Telephone Encounter (Signed)
 Last seen 03/01/23 and next f/u 04/17/24. Last refilled 11/08/23 #30. We cx appt 09/06/23 d/t MD being out and pt cx 09/19/23 appt.  I called pt and rescheduled for her to come in sooner on 12/18/23 at 930a to see Dr. Godwin Lat. Cx 10/25 appt.

## 2023-12-11 NOTE — Telephone Encounter (Signed)
 Pt is needing a refill request for her amphetamine -dextroamphetamine  (ADDERALL XR) 30 MG 24 hr capsule [facility] the [facility] on [address]

## 2023-12-15 ENCOUNTER — Telehealth: Payer: Self-pay | Admitting: Neurology

## 2023-12-15 NOTE — Telephone Encounter (Signed)
 Pt called to reschedule appt .  Appt Scheduled

## 2023-12-18 ENCOUNTER — Ambulatory Visit: Admitting: Neurology

## 2024-01-17 ENCOUNTER — Other Ambulatory Visit: Payer: Self-pay | Admitting: Neurology

## 2024-01-17 MED ORDER — AMPHETAMINE-DEXTROAMPHET ER 30 MG PO CP24: 30.0000 mg | ORAL_CAPSULE | Freq: Every day | ORAL | 0 refills | Status: DC

## 2024-01-17 NOTE — Telephone Encounter (Signed)
 Pt called to request  a refill  for medication   amphetamine -dextroamphetamine  (ADDERALL XR) 30 MG 24 hr capsule   Pt would like medication sent to :    [id number] [place], [place] - [address] ([place]) AT [address] (Ph: [phone])

## 2024-01-17 NOTE — Telephone Encounter (Signed)
 Last seen on 03/01/23 Follow up scheduled on 06/25/24   Dispensed Days Supply Quantity Provider Pharmacy  DEXTROAMP-AMPHET ER 30 MG CAP 12/12/2023 30 30 each Sater, Charlie LABOR, MD [facility] [phone] - R...     Rx pending to be signed

## 2024-01-22 ENCOUNTER — Other Ambulatory Visit: Payer: Self-pay | Admitting: Family Medicine

## 2024-01-22 DIAGNOSIS — I10 Essential (primary) hypertension: Secondary | ICD-10-CM

## 2024-01-23 NOTE — Telephone Encounter (Signed)
 Called patient, LVM to call office to schedule appt, thanks.

## 2024-01-23 NOTE — Telephone Encounter (Signed)
 Pls contact the pt to schedule appt with Dr. Alvan for HTN. Last seen 02/07/23. Sending RX without refills. Thx.

## 2024-02-07 ENCOUNTER — Other Ambulatory Visit: Payer: Self-pay | Admitting: Neurology

## 2024-02-07 NOTE — Telephone Encounter (Signed)
 Last seen on 03/01/23 Follow up scheduled on 06/25/24

## 2024-02-20 ENCOUNTER — Encounter: Payer: Self-pay | Admitting: Family Medicine

## 2024-02-20 ENCOUNTER — Telehealth: Payer: Self-pay

## 2024-02-20 NOTE — Telephone Encounter (Signed)
 Sent for medical records for PAP and Mammogram.

## 2024-02-27 ENCOUNTER — Other Ambulatory Visit: Payer: Self-pay | Admitting: Neurology

## 2024-02-27 MED ORDER — AMPHETAMINE-DEXTROAMPHET ER 30 MG PO CP24: 30.0000 mg | ORAL_CAPSULE | Freq: Every day | ORAL | 0 refills | Status: DC

## 2024-02-27 NOTE — Telephone Encounter (Signed)
 Last seen on 03/01/23 Follow up scheduled on 06/25/24  DEXTROAMP-AMPHET ER 30 MG CAP 01/17/2024 30 30 each Sater, Charlie LABOR, MD [facility] [phone] - R...   Rx pending to be signed

## 2024-02-27 NOTE — Telephone Encounter (Signed)
 Pt called to request Refill for Medication    amphetamine -dextroamphetamine  (ADDERALL XR) 30 MG 24 hr capsule  Pt would like medication to be sent to    [id number] [place], [place] - [address] ([place]) AT [address] (Ph: [phone])

## 2024-03-12 ENCOUNTER — Encounter: Payer: Self-pay | Admitting: Sports Medicine

## 2024-04-01 ENCOUNTER — Other Ambulatory Visit: Payer: Self-pay | Admitting: Family Medicine

## 2024-04-01 DIAGNOSIS — F418 Other specified anxiety disorders: Secondary | ICD-10-CM

## 2024-04-03 ENCOUNTER — Telehealth: Payer: Self-pay | Admitting: Neurology

## 2024-04-03 NOTE — Telephone Encounter (Signed)
 Pt is requesting a refill for amphetamine -dextroamphetamine  (ADDERALL XR) 30 MG 24 hr capsule.  [facility] [phone]

## 2024-04-04 ENCOUNTER — Other Ambulatory Visit: Payer: Self-pay

## 2024-04-04 MED ORDER — AMPHETAMINE-DEXTROAMPHET ER 30 MG PO CP24: 30.0000 mg | ORAL_CAPSULE | Freq: Every day | ORAL | 0 refills | Status: DC

## 2024-04-04 NOTE — Telephone Encounter (Signed)
 Requested Prescriptions   Pending Prescriptions Disp Refills   amphetamine -dextroamphetamine  (ADDERALL XR) 30 MG 24 hr capsule 30 capsule 0    Sig: Take 1 capsule (30 mg total) by mouth daily.   Last seen 03/01/23, next appt 06/25/24 Dispenses   Dispensed Days Supply Quantity Provider Pharmacy  DEXTROAMP-AMPHET ER 30 MG CAP 02/29/2024 30 30 each Sater, Charlie LABOR, MD [facility] [phone] - R...  DEXTROAMP-AMPHET ER 30 MG CAP 01/17/2024 30 30 each Sater, Charlie LABOR, MD [facility] [phone] - R...  DEXTROAMP-AMPHET ER 30 MG CAP 12/12/2023 30 30 each Sater, Charlie LABOR, MD [facility] [phone] - R...  DEXTROAMP-AMPHET ER 30 MG CAP 11/08/2023 30 30 each Sater, Charlie LABOR, MD [facility] [phone] - R...  DEXTROAMP-AMPHET ER 30 MG CAP 10/03/2023 30 30 each Sater, Charlie LABOR, MD [facility] [phone] - R...  DEXTROAMP-AMPHET ER 30 MG CAP 08/31/2023 30 30 each Sater, Charlie LABOR, MD [facility] [phone] - R...  DEXTROAMP-AMPHET ER 30 MG CAP 07/27/2023 30 30 each Sater, Charlie LABOR, MD [facility] [phone] - R...  DEXTROAMP-AMPHET ER 30 MG CAP 06/01/2023 30 30 each Sater, Charlie LABOR, MD [facility] [phone] - R...  DEXTROAMP-AMPHET ER 30 MG CAP 05/01/2023 30 30 each Sater, Charlie LABOR, MD [facility] [phone] - R...    Routing to work in to [employer]

## 2024-04-04 NOTE — Telephone Encounter (Signed)
 Last filled by patient on 02/27/24 30 days supply with 0 refills  Last office visit : 03/01/23 next office visit 06/25/24  Medication request was sent in by another nurse disregard.

## 2024-04-17 ENCOUNTER — Ambulatory Visit: Admitting: Neurology

## 2024-04-30 ENCOUNTER — Other Ambulatory Visit: Payer: Self-pay | Admitting: Neurology

## 2024-04-30 NOTE — Telephone Encounter (Signed)
 Last seen on 03/01/23 Follow up scheduled on 06/25/24

## 2024-05-13 ENCOUNTER — Other Ambulatory Visit: Payer: Self-pay | Admitting: Neurology

## 2024-05-13 MED ORDER — AMPHETAMINE-DEXTROAMPHET ER 30 MG PO CP24: 30.0000 mg | ORAL_CAPSULE | Freq: Every day | ORAL | 0 refills | Status: DC

## 2024-05-13 NOTE — Telephone Encounter (Signed)
 Patient request refill for amphetamine -dextroamphetamine  (ADDERALL XR) 30 MG 24 hr capsule [facility] [phone]

## 2024-05-13 NOTE — Telephone Encounter (Signed)
 Dr. Vear- pt has not been seen in over a yr. Last appt 03/01/23. Next appt scheduled for 06/25/24.  No mention in last note if you were going to continue refilling.   Last refilled 04/08/24 #30.  If you approve, please e-scribe

## 2024-06-14 ENCOUNTER — Other Ambulatory Visit: Payer: Self-pay | Admitting: Neurology

## 2024-06-14 MED ORDER — AMPHETAMINE-DEXTROAMPHET ER 30 MG PO CP24: 30.0000 mg | ORAL_CAPSULE | Freq: Every day | ORAL | 0 refills | Status: DC

## 2024-06-14 NOTE — Telephone Encounter (Signed)
 Requested Prescriptions   Pending Prescriptions Disp Refills   amphetamine -dextroamphetamine  (ADDERALL XR) 30 MG 24 hr capsule 30 capsule 0    Sig: Take 1 capsule (30 mg total) by mouth daily.   Last seen 03/01/23 Next appt scheduled 06/25/24 Dispenses   Dispensed Days Supply Quantity Provider Pharmacy  DEXTROAMP-AMPHET ER 30 MG CAP 05/13/2024 30 30 each Sater, Charlie LABOR, MD [facility] [phone] - R...  DEXTROAMP-AMPHET ER 30 MG CAP 04/08/2024 30 30 each Camara, Amadou, MD [facility] [phone] - R...  DEXTROAMP-AMPHET ER 30 MG CAP 02/29/2024 30 30 each Sater, Charlie LABOR, MD [facility] [phone] - R...  DEXTROAMP-AMPHET ER 30 MG CAP 01/17/2024 30 30 each Sater, Charlie LABOR, MD [facility] [phone] - R...  DEXTROAMP-AMPHET ER 30 MG CAP 12/12/2023 30 30 each Sater, Charlie LABOR, MD [facility] [phone] - R...  DEXTROAMP-AMPHET ER 30 MG CAP 11/08/2023 30 30 each Sater, Charlie LABOR, MD [facility] [phone] - R...  DEXTROAMP-AMPHET ER 30 MG CAP 10/03/2023 30 30 each Sater, Charlie LABOR, MD [facility] [phone] - R...  DEXTROAMP-AMPHET ER 30 MG CAP 08/31/2023 30 30 each Sater, Charlie LABOR, MD [facility] [phone] - R...  DEXTROAMP-AMPHET ER 30 MG CAP 07/27/2023 30 30 each Sater, Charlie LABOR, MD [facility] [phone] - R.SABRASABRA

## 2024-06-14 NOTE — Telephone Encounter (Signed)
 Pt called to request medication refill amphetamine -dextroamphetamine  (ADDERALL XR) 30 MG 24 hr capsule    Pt medication is to be sent  [id number] [place], [place] - [address] ([place]) AT [address] Phone: [phone]  Fax: [phone]

## 2024-06-25 ENCOUNTER — Ambulatory Visit: Admitting: Neurology

## 2024-07-18 ENCOUNTER — Other Ambulatory Visit: Payer: Self-pay | Admitting: Family Medicine

## 2024-07-18 DIAGNOSIS — I10 Essential (primary) hypertension: Secondary | ICD-10-CM

## 2024-07-19 ENCOUNTER — Other Ambulatory Visit: Payer: Self-pay | Admitting: Neurology

## 2024-07-19 NOTE — Telephone Encounter (Signed)
 Patient request refill for amphetamine -dextroamphetamine  (ADDERALL XR) 30 MG 24 hr capsule [facility] [phone]

## 2024-07-19 NOTE — Telephone Encounter (Signed)
 Requested Prescriptions   Pending Prescriptions Disp Refills   amphetamine -dextroamphetamine  (ADDERALL XR) 30 MG 24 hr capsule 30 capsule 0    Sig: Take 1 capsule (30 mg total) by mouth daily.   Last seen 03/01/23, next appt not scheduled  Dispenses   Dispensed Days Supply Quantity Provider Pharmacy  DEXTROAMP-AMPHET ER 30 MG CAP 06/14/2024 30 30 each Dohmeier, Dedra, MD [facility] [phone] - R...  DEXTROAMP-AMPHET ER 30 MG CAP 05/13/2024 30 30 each Sater, Charlie LABOR, MD [facility] [phone] - R...  DEXTROAMP-AMPHET ER 30 MG CAP 04/08/2024 30 30 each Camara, Amadou, MD [facility] [phone] - R...  DEXTROAMP-AMPHET ER 30 MG CAP 02/29/2024 30 30 each Sater, Charlie LABOR, MD [facility] [phone] - R...  DEXTROAMP-AMPHET ER 30 MG CAP 01/17/2024 30 30 each Sater, Charlie LABOR, MD [facility] [phone] - R...  DEXTROAMP-AMPHET ER 30 MG CAP 12/12/2023 30 30 each Sater, Charlie LABOR, MD [facility] [phone] - R...  DEXTROAMP-AMPHET ER 30 MG CAP 11/08/2023 30 30 each Sater, Charlie LABOR, MD [facility] [phone] - R...  DEXTROAMP-AMPHET ER 30 MG CAP 10/03/2023 30 30 each Sater, Charlie LABOR, MD [facility] [phone] - R...  DEXTROAMP-AMPHET ER 30 MG CAP 08/31/2023 30 30 each Sater, Charlie LABOR, MD [facility] [phone] - R...  DEXTROAMP-AMPHET ER 30 MG CAP 07/27/2023 30 30 each Sater, Charlie LABOR, MD [facility] [phone] - R.SABRASABRA

## 2024-07-22 MED ORDER — AMPHETAMINE-DEXTROAMPHET ER 30 MG PO CP24: 30.0000 mg | ORAL_CAPSULE | Freq: Every day | ORAL | 0 refills | Status: AC

## 2024-07-22 NOTE — Telephone Encounter (Signed)
 Requested Prescriptions   Pending Prescriptions Disp Refills   amphetamine -dextroamphetamine  (ADDERALL XR) 30 MG 24 hr capsule 30 capsule 0    Sig: Take 1 capsule (30 mg total) by mouth daily.   Last seen 03/01/23, next appt 08/19/24 Dispenses   Dispensed Days Supply Quantity Provider Pharmacy  DEXTROAMP-AMPHET ER 30 MG CAP 06/14/2024 30 30 each Dohmeier, Dedra, MD [facility] [phone] - R...  DEXTROAMP-AMPHET ER 30 MG CAP 05/13/2024 30 30 each Sater, Charlie LABOR, MD [facility] [phone] - R...  DEXTROAMP-AMPHET ER 30 MG CAP 04/08/2024 30 30 each Camara, Amadou, MD [facility] [phone] - R...  DEXTROAMP-AMPHET ER 30 MG CAP 02/29/2024 30 30 each Sater, Charlie LABOR, MD [facility] [phone] - R...  DEXTROAMP-AMPHET ER 30 MG CAP 01/17/2024 30 30 each Sater, Charlie LABOR, MD [facility] [phone] - R...  DEXTROAMP-AMPHET ER 30 MG CAP 12/12/2023 30 30 each Sater, Charlie LABOR, MD [facility] [phone] - R...  DEXTROAMP-AMPHET ER 30 MG CAP 11/08/2023 30 30 each Sater, Charlie LABOR, MD [facility] [phone] - R...  DEXTROAMP-AMPHET ER 30 MG CAP 10/03/2023 30 30 each Sater, Charlie LABOR, MD [facility] [phone] - R...  DEXTROAMP-AMPHET ER 30 MG CAP 08/31/2023 30 30 each Sater, Charlie LABOR, MD [facility] [phone] - R...  DEXTROAMP-AMPHET ER 30 MG CAP 07/27/2023 30 30 each Sater, Charlie LABOR, MD [facility] [phone] - R.SABRASABRA

## 2024-07-22 NOTE — Telephone Encounter (Signed)
 Pt has scheduled a f/u appt. Please fill prescription.

## 2024-08-05 ENCOUNTER — Other Ambulatory Visit: Payer: Self-pay

## 2024-08-05 ENCOUNTER — Other Ambulatory Visit: Payer: Self-pay | Admitting: Neurology

## 2024-08-19 ENCOUNTER — Ambulatory Visit: Admitting: Neurology
